# Patient Record
Sex: Female | Born: 1990 | Race: White | Hispanic: No | Smoking: Light tobacco smoker
Health system: Southern US, Community
[De-identification: ages and names within clinical notes are randomized; demographics above are authoritative.]

## PROBLEM LIST (undated history)

## (undated) DIAGNOSIS — Z789 Other specified health status: Secondary | ICD-10-CM

## (undated) DIAGNOSIS — E063 Autoimmune thyroiditis: Secondary | ICD-10-CM

## (undated) HISTORY — PX: OTHER SURGICAL HISTORY: SHX169

---

## 2016-07-16 ENCOUNTER — Encounter: Payer: Self-pay | Admitting: Obstetrics

## 2016-07-16 ENCOUNTER — Other Ambulatory Visit (HOSPITAL_COMMUNITY)
Admission: RE | Admit: 2016-07-16 | Discharge: 2016-07-16 | Disposition: A | Discharge status: Home / Self Care | Payer: Medicaid Other | Source: Ambulatory Visit | Attending: Obstetrics | Admitting: Obstetrics

## 2016-07-16 ENCOUNTER — Ambulatory Visit (INDEPENDENT_AMBULATORY_CARE_PROVIDER_SITE_OTHER): Payer: Medicaid Other | Admitting: Obstetrics

## 2016-07-16 VITALS — BP 123/77 | HR 93 | Temp 97.4°F | Ht 62.0 in | Wt 167.1 lb

## 2016-07-16 DIAGNOSIS — Z3490 Encounter for supervision of normal pregnancy, unspecified, unspecified trimester: Secondary | ICD-10-CM | POA: Insufficient documentation

## 2016-07-16 DIAGNOSIS — Z01411 Encounter for gynecological examination (general) (routine) with abnormal findings: Secondary | ICD-10-CM | POA: Insufficient documentation

## 2016-07-16 DIAGNOSIS — Z3400 Encounter for supervision of normal first pregnancy, unspecified trimester: Secondary | ICD-10-CM

## 2016-07-16 DIAGNOSIS — Z3402 Encounter for supervision of normal first pregnancy, second trimester: Secondary | ICD-10-CM | POA: Diagnosis not present

## 2016-07-16 NOTE — Progress Notes (Signed)
Subjective:    Joan Mccarty is being seen today for her first obstetrical visit.  This is not a planned pregnancy. She is at 4266w0d gestation. Her obstetrical history is significant for smoker. Relationship with FOB: significant other, not living together. Patient does intend to breast feed. Pregnancy history fully reviewed.  The information documented in the HPI was reviewed and verified.  Menstrual History: OB History    Gravida Para Term Preterm AB Living   1             SAB TAB Ectopic Multiple Live Births                  No LMP recorded (lmp unknown). Patient is pregnant.    History reviewed. No pertinent past medical history.  Past Surgical History:  Procedure Laterality Date  . wisdom toothh Bilateral      (Not in a hospital admission) Not on File  Social History  Substance Use Topics  . Smoking status: Current Some Day Smoker  . Smokeless tobacco: Never Used  . Alcohol use No    Family History  Problem Relation Age of Onset  . Adopted: Yes     Review of Systems Constitutional: negative for weight loss Gastrointestinal: negative for vomiting Genitourinary:negative for genital lesions and vaginal discharge and dysuria Musculoskeletal:negative for back pain Behavioral/Psych: negative for abusive relationship, depression, illegal drug usage and tobacco use    Objective:    BP 123/77   Pulse 93   Temp 97.4 F (36.3 C)   Ht 5\' 2"  (1.575 m)   Wt 167 lb 1.6 oz (75.8 kg)   LMP  (LMP Unknown)   BMI 30.56 kg/m  General Appearance:    Alert, cooperative, no distress, appears stated age  Head:    Normocephalic, without obvious abnormality, atraumatic  Eyes:    PERRL, conjunctiva/corneas clear, EOM's intact, fundi    benign, both eyes  Ears:    Normal TM's and external ear canals, both ears  Nose:   Nares normal, septum midline, mucosa normal, no drainage    or sinus tenderness  Throat:   Lips, mucosa, and tongue normal; teeth and gums normal  Neck:   Supple,  symmetrical, trachea midline, no adenopathy;    thyroid:  no enlargement/tenderness/nodules; no carotid   bruit or JVD  Back:     Symmetric, no curvature, ROM normal, no CVA tenderness  Lungs:     Clear to auscultation bilaterally, respirations unlabored  Chest Wall:    No tenderness or deformity   Heart:    Regular rate and rhythm, S1 and S2 normal, no murmur, rub   or gallop  Breast Exam:    No tenderness, masses, or nipple abnormality  Abdomen:     Soft, non-tender, bowel sounds active all four quadrants,    no masses, no organomegaly  Genitalia:    Normal female without lesion, discharge or tenderness  Extremities:   Extremities normal, atraumatic, no cyanosis or edema  Pulses:   2+ and symmetric all extremities  Skin:   Skin color, texture, turgor normal, no rashes or lesions  Lymph nodes:   Cervical, supraclavicular, and axillary nodes normal  Neurologic:   CNII-XII intact, normal strength, sensation and reflexes    throughout      Lab Review Urine pregnancy test Labs reviewed yes Radiologic studies reviewed yes Assessment:    Pregnancy at [redacted]w[redacted]d weeks    Plan:      Prenatal vitamins.  Counseling provided regarding continued use  of seat belts, cessation of alcohol consumption, smoking or use of illicit drugs; infection precautions i.e., influenza/TDAP immunizations, toxoplasmosis,CMV, parvovirus, listeria and varicella; workplace safety, exercise during pregnancy; routine dental care, safe medications, sexual activity, hot tubs, saunas, pools, travel, caffeine use, fish and methlymercury, potential toxins, hair treatments, varicose veins Weight gain recommendations per IOM guidelines reviewed: underweight/BMI< 18.5--> gain 28 - 40 lbs; normal weight/BMI 18.5 - 24.9--> gain 25 - 35 lbs; overweight/BMI 25 - 29.9--> gain 15 - 25 lbs; obese/BMI >30->gain  11 - 20 lbs Problem list reviewed and updated. FIRST/CF mutation testing/NIPT/QUAD SCREEN/fragile X/Ashkenazi Jewish population  testing/Spinal muscular atrophy discussed: requested. Role of ultrasound in pregnancy discussed; fetal survey: requested. Amniocentesis discussed: not indicated. VBAC calculator score: VBAC consent form provided No orders of the defined types were placed in this encounter.  No orders of the defined types were placed in this encounter.   Follow up in 4 weeks. 50% of 20 min visit spent on counseling and coordination of care. Patient ID: Joan Mccarty, female   DOB: 1991-05-11, 25 y.o.   MRN: [id number]

## 2016-07-16 NOTE — Addendum Note (Signed)
Addended by: Lillie ColumbiaJAMES, Rhina Kramme C on: 07/16/2016 09:37 AM   Modules accepted: Orders

## 2016-07-18 LAB — CYTOLOGY - PAP: DIAGNOSIS: NEGATIVE

## 2016-07-19 ENCOUNTER — Other Ambulatory Visit: Payer: Self-pay | Admitting: Obstetrics

## 2016-07-19 DIAGNOSIS — N760 Acute vaginitis: Principal | ICD-10-CM

## 2016-07-19 DIAGNOSIS — B9689 Other specified bacterial agents as the cause of diseases classified elsewhere: Secondary | ICD-10-CM

## 2016-07-19 LAB — NUSWAB VG+, CANDIDA 6SP
Atopobium vaginae: HIGH Score — AB
BVAB 2: HIGH {score} — AB
CANDIDA ALBICANS, NAA: NEGATIVE
CANDIDA PARAPSILOSIS, NAA: NEGATIVE
CANDIDA TROPICALIS, NAA: NEGATIVE
Candida glabrata, NAA: NEGATIVE
Candida krusei, NAA: NEGATIVE
Candida lusitaniae, NAA: NEGATIVE
Chlamydia trachomatis, NAA: NEGATIVE
MEGASPHAERA 1: HIGH {score} — AB
Neisseria gonorrhoeae, NAA: NEGATIVE
TRICH VAG BY NAA: NEGATIVE

## 2016-07-19 MED ORDER — METRONIDAZOLE 500 MG PO TABS: 500.0000 mg | ORAL_TABLET | Freq: Two times a day (BID) | ORAL | 2 refills | Status: DC

## 2016-07-22 NOTE — L&D Delivery Note (Signed)
Delivery Note  The patient progressed slowly through second stage labor. She intermittently patient rested. She had difficulty maintaining ineffective pushing  She was allowed to rest again while another cesarean section was being performed by another physician. An approximately 7:15 PM she had been counseled over the possibility that vacuum assistance could be offered. Her risks benefits and alternatives including cesarean section were discussed with the patient. The patient agreed to proceeding with vacuum-assisted attempted delivery. There was removed from the bladder. The bladder was empty. Was applied and patient allowed to push. This required approximately 5 contractions. There were 2 pop offs. This occurred on the first 2 contractions and steady progress is made. Once the baby delivered on the fifth contraction, there was a significant shoulder dystocia. The infant's right shoulder was impacted anteriorly and difficult access. [place], suprapubic pressure and wood screw counterclockwise rotation of the body were all required in combination to achieve delivery of the infant. Was able to rotate the fetal body completely in the vagina and delivered the posterior shoulder first once did rotated to an anterior position. The car was called and they were in her presence within a minute after the baby was born. The baby responded well to tactile stimulation and had good use of all extremities and responded to ventilation blow by oxygen and required no further support. Baby was taken care of by the mother. A mediolateral right-sided episiotomy had been performed to allow access to the posterior shoulder and this was repaired after delivery of the placenta which was intact Tomasa BlaseSchultz presentation three-vessel cord confirmed. EBL was 4 50 cc a mediolateral episiotomy did not involve the anal sphincter and patient had good repair under local anesthesia combined with the epidural and was able to show interest  in the baby and normal bonding efforts. Sponge and needle counts were correct At 7:26 AM a viable and healthy female was delivered via Vaginal, Vacuum (Extractor) (Presentation: ;  ).  APGAR: 8, 9; weight 8 lb 14.5 oz (4040 g).   Placenta status: , .  Cord:  with the following complications: .  Cord pH: The infant's chart would contain this information. Blood gas was collected  Anesthesia:   Episiotomy: Left Mediolateral Lacerations:   Suture Repair: 2.0 vicryl Est. Blood Loss (mL): 450  Mom to postpartum.  Baby to Couplet care / Skin to Skin.  Erez Mccallum V 02/11/2017, 3:27 PM

## 2016-08-13 ENCOUNTER — Ambulatory Visit (INDEPENDENT_AMBULATORY_CARE_PROVIDER_SITE_OTHER): Payer: Medicaid Other | Admitting: Obstetrics and Gynecology

## 2016-08-13 ENCOUNTER — Encounter: Payer: Self-pay | Admitting: Obstetrics and Gynecology

## 2016-08-13 DIAGNOSIS — F1911 Other psychoactive substance abuse, in remission: Secondary | ICD-10-CM | POA: Insufficient documentation

## 2016-08-13 DIAGNOSIS — Z3490 Encounter for supervision of normal pregnancy, unspecified, unspecified trimester: Secondary | ICD-10-CM

## 2016-08-13 DIAGNOSIS — Z3492 Encounter for supervision of normal pregnancy, unspecified, second trimester: Secondary | ICD-10-CM

## 2016-08-13 DIAGNOSIS — Z87898 Personal history of other specified conditions: Secondary | ICD-10-CM

## 2016-08-13 NOTE — Progress Notes (Signed)
Subjective:  Joan Mccarty is a 26 y.o. G2P1001 at 587w0d being seen today for ongoing prenatal care.  She is currently monitored for the following issues for this high-risk pregnancy and has Supervision of normal pregnancy, antepartum and History of drug abuse on her problem list.  Patient reports no complaints.  Contractions: Not present. Vag. Bleeding: None.  Movement: Present. Denies leaking of fluid.   The following portions of the patient's history were reviewed and updated as appropriate: allergies, current medications, past family history, past medical history, past social history, past surgical history and problem list. Problem list updated.  Objective:   Vitals:   08/13/16 0852  BP: 113/74  Pulse: 98  Weight: 170 lb (77.1 kg)    Fetal Status: Fetal Heart Rate (bpm): 148   Movement: Present     General:  Alert, oriented and cooperative. Patient is in no acute distress.  Skin: Skin is warm and dry. No rash noted.   Cardiovascular: Normal heart rate noted  Respiratory: Normal respiratory effort, no problems with respiration noted  Abdomen: Soft, gravid, appropriate for gestational age. Pain/Pressure: Absent     Pelvic:  Cervical exam deferred        Extremities: Normal range of motion.  Edema: None  Mental Status: Normal mood and affect. Normal behavior. Normal judgment and thought content.   Urinalysis:      Assessment and Plan:  Pregnancy: G2P1001 at 597w0d  1. Encounter for supervision of normal pregnancy, antepartum, unspecified gravidity Complete Prenatal labs today - AFP, Quad Screen - Obstetric Panel, Including HIV - Culture, OB Urine - Varicella zoster antibody, IgG - US MFM OB COMP + 14 WK; Future - ToxASSURE Select 13 (MW), Urine  2. History of drug abuse In remission x 5 yrs now [facility]  Preterm labor symptoms and general obstetric precautions including but not limited to vaginal bleeding, contractions, leaking of fluid and  fetal movement were reviewed in detail with the patient. Please refer to After Visit Summary for other counseling recommendations.  Return in about 4 weeks (around 09/10/2016) for OB visit.   Hermina StaggersMichael L Ervin, MD

## 2016-08-13 NOTE — Patient Instructions (Signed)
Second Trimester of Pregnancy The second trimester is from week 13 through week 28 (months 4 through 6). The second trimester is often a time when you feel your best. Your body has also adjusted to being pregnant, and you begin to feel better physically. Usually, morning sickness has lessened or quit completely, you may have more energy, and you may have an increase in appetite. The second trimester is also a time when the fetus is growing rapidly. At the end of the sixth month, the fetus is about 9 inches long and weighs about 1 pounds. You will likely begin to feel the baby move (quickening) between 18 and 20 weeks of the pregnancy. Body changes during your second trimester Your body continues to go through many changes during your second trimester. The changes vary from woman to woman.  Your weight will continue to increase. You will notice your lower abdomen bulging out.  You may begin to get stretch marks on your hips, abdomen, and breasts.  You may develop headaches that can be relieved by medicines. The medicines should be approved by your health care provider.  You may urinate more often because the fetus is pressing on your bladder.  You may develop or continue to have heartburn as a result of your pregnancy.  You may develop constipation because certain hormones are causing the muscles that push waste through your intestines to slow down.  You may develop hemorrhoids or swollen, bulging veins (varicose veins).  You may have back pain. This is caused by:  Weight gain.  Pregnancy hormones that are relaxing the joints in your pelvis.  A shift in weight and the muscles that support your balance.  Your breasts will continue to grow and they will continue to become tender.  Your gums may bleed and may be sensitive to brushing and flossing.  Dark spots or blotches (chloasma, mask of pregnancy) may develop on your face. This will likely fade after the baby is born.  A dark line  from your belly button to the pubic area (linea nigra) may appear. This will likely fade after the baby is born.  You may have changes in your hair. These can include thickening of your hair, rapid growth, and changes in texture. Some women also have hair loss during or after pregnancy, or hair that feels dry or thin. Your hair will most likely return to normal after your baby is born. What to expect at prenatal visits During a routine prenatal visit:  You will be weighed to make sure you and the fetus are growing normally.  Your blood pressure will be taken.  Your abdomen will be measured to track your baby's growth.  The fetal heartbeat will be listened to.  Any test results from the previous visit will be discussed. Your health care provider may ask you:  How you are feeling.  If you are feeling the baby move.  If you have had any abnormal symptoms, such as leaking fluid, bleeding, severe headaches, or abdominal cramping.  If you are using any tobacco products, including cigarettes, chewing tobacco, and electronic cigarettes.  If you have any questions. Other tests that may be performed during your second trimester include:  Blood tests that check for:  Low iron levels (anemia).  Gestational diabetes (between 24 and 28 weeks).  Rh antibodies. This is to check for a protein on red blood cells (Rh factor).  Urine tests to check for infections, diabetes, or protein in the urine.  An ultrasound to   confirm the proper growth and development of the baby.  An amniocentesis to check for possible genetic problems.  Fetal screens for spina bifida and Down syndrome.  HIV (human immunodeficiency virus) testing. Routine prenatal testing includes screening for HIV, unless you choose not to have this test. Follow these instructions at home: Eating and drinking  Continue to eat regular, healthy meals.  Avoid raw meat, uncooked cheese, cat litter boxes, and soil used by cats. These  carry germs that can cause birth defects in the baby.  Take your prenatal vitamins.  Take 1500-2000 mg of calcium daily starting at the 20th week of pregnancy until you deliver your baby.  If you develop constipation:  Take over-the-counter or prescription medicines.  Drink enough fluid to keep your urine clear or pale yellow.  Eat foods that are high in fiber, such as fresh fruits and vegetables, whole grains, and beans.  Limit foods that are high in fat and processed sugars, such as fried and sweet foods. Activity  Exercise only as directed by your health care provider. Experiencing uterine cramps is a good sign to stop exercising.  Avoid heavy lifting, wear low heel shoes, and practice good posture.  Wear your seat belt at all times when driving.  Rest with your legs elevated if you have leg cramps or low back pain.  Wear a good support bra for breast tenderness.  Do not use hot tubs, steam rooms, or saunas. Lifestyle  Avoid all smoking, herbs, alcohol, and unprescribed drugs. These chemicals affect the formation and growth of the baby.  Do not use any products that contain nicotine or tobacco, such as cigarettes and e-cigarettes. If you need help quitting, ask your health care provider.  A sexual relationship may be continued unless your health care provider directs you otherwise. General instructions  Follow your health care provider's instructions regarding medicine use. There are medicines that are either safe or unsafe to take during pregnancy.  Take warm sitz baths to soothe any pain or discomfort caused by hemorrhoids. Use hemorrhoid cream if your health care provider approves.  If you develop varicose veins, wear support hose. Elevate your feet for 15 minutes, 3-4 times a day. Limit salt in your diet.  Visit your dentist if you have not gone yet during your pregnancy. Use a soft toothbrush to brush your teeth and be gentle when you floss.  Keep all follow-up  prenatal visits as told by your health care provider. This is important. Contact a health care provider if:  You have dizziness.  You have mild pelvic cramps, pelvic pressure, or nagging pain in the abdominal area.  You have persistent nausea, vomiting, or diarrhea.  You have a bad smelling vaginal discharge.  You have pain with urination. Get help right away if:  You have a fever.  You are leaking fluid from your vagina.  You have spotting or bleeding from your vagina.  You have severe abdominal cramping or pain.  You have rapid weight gain or weight loss.  You have shortness of breath with chest pain.  You notice sudden or extreme swelling of your face, hands, ankles, feet, or legs.  You have not felt your baby move in over an hour.  You have severe headaches that do not go away with medicine.  You have vision changes. Summary  The second trimester is from week 13 through week 28 (months 4 through 6). It is also a time when the fetus is growing rapidly.  Your body goes   through many changes during pregnancy. The changes vary from woman to woman.  Avoid all smoking, herbs, alcohol, and unprescribed drugs. These chemicals affect the formation and growth your baby.  Do not use any tobacco products, such as cigarettes, chewing tobacco, and e-cigarettes. If you need help quitting, ask your health care provider.  Contact your health care provider if you have any questions. Keep all prenatal visits as told by your health care provider. This is important. This information is not intended to replace advice given to you by your health care provider. Make sure you discuss any questions you have with your health care provider. Document Released: 07/02/2001 Document Revised: 12/14/2015 Document Reviewed: 09/08/2012 Elsevier Interactive Patient Education  2017 [employer]vier Inc.  

## 2016-08-15 LAB — URINE CULTURE, OB REFLEX

## 2016-08-15 LAB — CULTURE, OB URINE

## 2016-08-18 LAB — TOXASSURE SELECT 13 (MW), URINE

## 2016-08-20 LAB — OBSTETRIC PANEL, INCLUDING HIV
Antibody Screen: NEGATIVE
BASOS ABS: 0 10*3/uL (ref 0.0–0.2)
Basos: 0 %
EOS (ABSOLUTE): 0.1 10*3/uL (ref 0.0–0.4)
Eos: 1 %
HIV Screen 4th Generation wRfx: NONREACTIVE
Hematocrit: 40.2 % (ref 34.0–46.6)
Hemoglobin: 13.4 g/dL (ref 11.1–15.9)
Hepatitis B Surface Ag: NEGATIVE
IMMATURE GRANS (ABS): 0 10*3/uL (ref 0.0–0.1)
Immature Granulocytes: 0 %
LYMPHS: 28 %
Lymphocytes Absolute: 2.2 10*3/uL (ref 0.7–3.1)
MCH: 28.9 pg (ref 26.6–33.0)
MCHC: 33.3 g/dL (ref 31.5–35.7)
MCV: 87 fL (ref 79–97)
MONOCYTES: 4 %
MONOS ABS: 0.3 10*3/uL (ref 0.1–0.9)
Neutrophils Absolute: 5.1 10*3/uL (ref 1.4–7.0)
Neutrophils: 67 %
PLATELETS: 168 10*3/uL (ref 150–379)
RBC: 4.64 x10E6/uL (ref 3.77–5.28)
RDW: 13.3 % (ref 12.3–15.4)
RPR Ser Ql: NONREACTIVE
RUBELLA: 1.38 {index} (ref >0.99)
Rh Factor: POSITIVE
WBC: 7.7 10*3/uL (ref 3.4–10.8)

## 2016-08-20 LAB — AFP, QUAD SCREEN
DIA MOM VALUE: 1.44
DIA Value (EIA): 243 pg/mL
DSR (BY AGE) 1 IN: 835
DSR (SECOND TRIMESTER) 1 IN: 8527
Gestational Age: 16 WEEKS
MATERNAL AGE AT EDD: 28.3 a
MSAFP Mom: 0.79
MSAFP: 23.7 ng/mL
MSHCG MOM: 0.49
MSHCG: 18112 m[IU]/mL
OSB RISK: 10000
T18 (By Age): 1:3252 {titer}
TEST RESULTS AFP: NEGATIVE
WEIGHT: 170 [lb_av]
uE3 Mom: 1.1
uE3 Value: 0.87 ng/mL

## 2016-08-20 LAB — VARICELLA ZOSTER ANTIBODY, IGG: VARICELLA: 521 {index} (ref >165)

## 2016-08-22 ENCOUNTER — Encounter (HOSPITAL_COMMUNITY): Payer: Self-pay | Admitting: Obstetrics and Gynecology

## 2016-08-28 ENCOUNTER — Ambulatory Visit (HOSPITAL_COMMUNITY)
Admission: RE | Admit: 2016-08-28 | Discharge: 2016-08-28 | Disposition: A | Discharge status: Home / Self Care | Payer: Medicaid Other | Source: Ambulatory Visit | Attending: Obstetrics and Gynecology | Admitting: Obstetrics and Gynecology

## 2016-08-28 ENCOUNTER — Other Ambulatory Visit: Payer: Self-pay | Admitting: Obstetrics and Gynecology

## 2016-08-28 DIAGNOSIS — Z3A18 18 weeks gestation of pregnancy: Secondary | ICD-10-CM | POA: Diagnosis not present

## 2016-08-28 DIAGNOSIS — O99322 Drug use complicating pregnancy, second trimester: Secondary | ICD-10-CM | POA: Insufficient documentation

## 2016-08-28 DIAGNOSIS — O99332 Smoking (tobacco) complicating pregnancy, second trimester: Secondary | ICD-10-CM | POA: Diagnosis not present

## 2016-08-28 DIAGNOSIS — F1920 Other psychoactive substance dependence, uncomplicated: Secondary | ICD-10-CM | POA: Diagnosis present

## 2016-08-28 DIAGNOSIS — Z3490 Encounter for supervision of normal pregnancy, unspecified, unspecified trimester: Secondary | ICD-10-CM

## 2016-08-28 DIAGNOSIS — F1120 Opioid dependence, uncomplicated: Secondary | ICD-10-CM

## 2016-09-10 ENCOUNTER — Encounter: Payer: Medicaid Other | Admitting: Obstetrics and Gynecology

## 2016-10-09 ENCOUNTER — Ambulatory Visit (HOSPITAL_COMMUNITY)
Admission: RE | Admit: 2016-10-09 | Discharge: 2016-10-09 | Disposition: A | Discharge status: Home / Self Care | Payer: Medicaid Other | Source: Ambulatory Visit | Attending: Obstetrics and Gynecology | Admitting: Obstetrics and Gynecology

## 2016-10-09 ENCOUNTER — Encounter (HOSPITAL_COMMUNITY): Payer: Self-pay

## 2016-10-09 DIAGNOSIS — F1910 Other psychoactive substance abuse, uncomplicated: Secondary | ICD-10-CM | POA: Diagnosis not present

## 2016-10-09 DIAGNOSIS — Z362 Encounter for other antenatal screening follow-up: Secondary | ICD-10-CM | POA: Insufficient documentation

## 2016-10-09 DIAGNOSIS — O99332 Smoking (tobacco) complicating pregnancy, second trimester: Secondary | ICD-10-CM | POA: Diagnosis not present

## 2016-10-09 DIAGNOSIS — Z3A24 24 weeks gestation of pregnancy: Secondary | ICD-10-CM | POA: Insufficient documentation

## 2016-10-09 DIAGNOSIS — F1120 Opioid dependence, uncomplicated: Secondary | ICD-10-CM

## 2016-10-09 DIAGNOSIS — O99322 Drug use complicating pregnancy, second trimester: Secondary | ICD-10-CM | POA: Insufficient documentation

## 2016-10-09 HISTORY — DX: Other specified health status: Z78.9

## 2016-10-10 ENCOUNTER — Other Ambulatory Visit (HOSPITAL_COMMUNITY): Payer: Self-pay | Admitting: *Deleted

## 2016-10-10 DIAGNOSIS — F1120 Opioid dependence, uncomplicated: Secondary | ICD-10-CM

## 2016-10-10 DIAGNOSIS — O99320 Drug use complicating pregnancy, unspecified trimester: Principal | ICD-10-CM

## 2016-11-20 ENCOUNTER — Encounter (HOSPITAL_COMMUNITY): Payer: Self-pay

## 2016-11-20 ENCOUNTER — Ambulatory Visit (HOSPITAL_COMMUNITY)
Admission: RE | Admit: 2016-11-20 | Discharge: 2016-11-20 | Disposition: A | Discharge status: Home / Self Care | Payer: Medicaid Other | Source: Ambulatory Visit | Attending: Obstetrics and Gynecology | Admitting: Obstetrics and Gynecology

## 2016-11-20 ENCOUNTER — Other Ambulatory Visit (HOSPITAL_COMMUNITY): Payer: Self-pay | Admitting: Maternal and Fetal Medicine

## 2016-11-20 DIAGNOSIS — O09893 Supervision of other high risk pregnancies, third trimester: Secondary | ICD-10-CM | POA: Diagnosis not present

## 2016-11-20 DIAGNOSIS — Z3A30 30 weeks gestation of pregnancy: Secondary | ICD-10-CM | POA: Diagnosis not present

## 2016-11-20 DIAGNOSIS — O99320 Drug use complicating pregnancy, unspecified trimester: Principal | ICD-10-CM

## 2016-11-20 DIAGNOSIS — O99333 Smoking (tobacco) complicating pregnancy, third trimester: Secondary | ICD-10-CM

## 2016-11-20 DIAGNOSIS — O99323 Drug use complicating pregnancy, third trimester: Secondary | ICD-10-CM | POA: Diagnosis present

## 2016-11-20 DIAGNOSIS — F1120 Opioid dependence, uncomplicated: Secondary | ICD-10-CM

## 2016-11-20 DIAGNOSIS — F17200 Nicotine dependence, unspecified, uncomplicated: Secondary | ICD-10-CM | POA: Insufficient documentation

## 2016-11-21 ENCOUNTER — Other Ambulatory Visit (HOSPITAL_COMMUNITY): Payer: Self-pay | Admitting: *Deleted

## 2016-11-21 DIAGNOSIS — O99323 Drug use complicating pregnancy, third trimester: Principal | ICD-10-CM

## 2016-11-21 DIAGNOSIS — F1120 Opioid dependence, uncomplicated: Secondary | ICD-10-CM

## 2016-12-02 ENCOUNTER — Encounter: Payer: Self-pay | Admitting: Obstetrics

## 2016-12-02 ENCOUNTER — Other Ambulatory Visit: Payer: Medicaid Other

## 2016-12-02 ENCOUNTER — Ambulatory Visit (INDEPENDENT_AMBULATORY_CARE_PROVIDER_SITE_OTHER): Payer: Medicaid Other | Admitting: Obstetrics

## 2016-12-02 VITALS — BP 111/75 | HR 93 | Wt 186.4 lb

## 2016-12-02 DIAGNOSIS — Z23 Encounter for immunization: Secondary | ICD-10-CM

## 2016-12-02 DIAGNOSIS — Z3483 Encounter for supervision of other normal pregnancy, third trimester: Secondary | ICD-10-CM

## 2016-12-02 DIAGNOSIS — Z3480 Encounter for supervision of other normal pregnancy, unspecified trimester: Secondary | ICD-10-CM

## 2016-12-02 DIAGNOSIS — J301 Allergic rhinitis due to pollen: Secondary | ICD-10-CM

## 2016-12-02 DIAGNOSIS — Z3490 Encounter for supervision of normal pregnancy, unspecified, unspecified trimester: Secondary | ICD-10-CM

## 2016-12-02 MED ORDER — LORATADINE 10 MG PO TABS: 10.0000 mg | ORAL_TABLET | Freq: Every day | ORAL | 11 refills | Status: DC

## 2016-12-02 NOTE — Addendum Note (Signed)
Addended by: Hamilton CapriBURCH, ARIEL J on: 12/02/2016 11:39 AM   Modules accepted: Orders

## 2016-12-02 NOTE — Addendum Note (Signed)
Addended by: Dalphine HandingGARDNER, Laquanda Bick L on: 12/02/2016 11:54 AM   Modules accepted: Orders

## 2016-12-02 NOTE — Addendum Note (Signed)
Addended by: Dalphine HandingGARDNER, Fares Ramthun L on: 12/02/2016 12:02 PM   Modules accepted: Orders

## 2016-12-02 NOTE — Progress Notes (Signed)
Subjective:  Joan Mccarty is a 26 y.o. G2P1001 at 3267w6d being seen today for ongoing prenatal care.  She is currently monitored for the following issues for this high-risk pregnancy and has Supervision of normal pregnancy, antepartum and History of drug abuse on her problem list.  Patient reports allergy to pollen worse.  Contractions: Not present. Vag. Bleeding: None.  Movement: Present. Denies leaking of fluid.   The following portions of the patient's history were reviewed and updated as appropriate: allergies, current medications, past family history, past medical history, past social history, past surgical history and problem list. Problem list updated.  Objective:   Vitals:   12/02/16 0910  BP: 111/75  Pulse: 93  Weight: 186 lb 6.4 oz (84.6 kg)    Fetal Status: Fetal Heart Rate (bpm): 126   Movement: Present     General:  Alert, oriented and cooperative. Patient is in no acute distress.  Skin: Skin is warm and dry. No rash noted.   Cardiovascular: Normal heart rate noted  Respiratory: Normal respiratory effort, no problems with respiration noted  Abdomen: Soft, gravid, appropriate for gestational age. Pain/Pressure: Absent     Pelvic:  Cervical exam deferred        Extremities: Normal range of motion.  Edema: None  Mental Status: Normal mood and affect. Normal behavior. Normal judgment and thought content.   Urinalysis:      Assessment and Plan:  Pregnancy: G2P1001 at 267w6d  1. Encounter for supervision of normal pregnancy, antepartum, unspecified gravidity Rx: - Tdap vaccine greater than or equal to 7yo IM  2. Seasonal allergic rhinitis due to pollen Rx: - loratadine (CLARITIN) 10 MG tablet; Take 1 tablet (10 mg total) by mouth daily.  Dispense: 30 tablet; Refill: 11  Preterm labor symptoms and general obstetric precautions including but not limited to vaginal bleeding, contractions, leaking of fluid and fetal movement were reviewed in detail with the patient. Please  refer to After Visit Summary for other counseling recommendations.  No Follow-up on file.   Brock BadHarper, Dyneshia Baccam A, MDPatient ID: Joan Mccarty, female   DOB: 1991/06/04, 26 y.o.   MRN: [id number]

## 2016-12-03 LAB — RPR: RPR: NONREACTIVE

## 2016-12-03 LAB — GLUCOSE TOLERANCE, 2 HOURS W/ 1HR
GLUCOSE, 1 HOUR: 91 mg/dL (ref 65–179)
GLUCOSE, FASTING: 68 mg/dL (ref 65–91)
Glucose, 2 hour: 80 mg/dL (ref 65–152)

## 2016-12-03 LAB — CBC
Hematocrit: 36.9 % (ref 34.0–46.6)
Hemoglobin: 12.2 g/dL (ref 11.1–15.9)
MCH: 29.2 pg (ref 26.6–33.0)
MCHC: 33.1 g/dL (ref 31.5–35.7)
MCV: 88 fL (ref 79–97)
PLATELETS: 187 10*3/uL (ref 150–379)
RBC: 4.18 x10E6/uL (ref 3.77–5.28)
RDW: 13.3 % (ref 12.3–15.4)
WBC: 10.5 10*3/uL (ref 3.4–10.8)

## 2016-12-03 LAB — HIV ANTIBODY (ROUTINE TESTING W REFLEX): HIV SCREEN 4TH GENERATION: NONREACTIVE

## 2016-12-07 LAB — GLUCOSE TOLERANCE, 2 HOURS

## 2016-12-08 ENCOUNTER — Other Ambulatory Visit: Payer: Self-pay | Admitting: Obstetrics

## 2016-12-08 DIAGNOSIS — B3731 Acute candidiasis of vulva and vagina: Secondary | ICD-10-CM

## 2016-12-08 DIAGNOSIS — B373 Candidiasis of vulva and vagina: Secondary | ICD-10-CM

## 2016-12-08 MED ORDER — FLUCONAZOLE 150 MG PO TABS: 150.0000 mg | ORAL_TABLET | Freq: Once | ORAL | 0 refills | Status: DC

## 2016-12-17 ENCOUNTER — Encounter: Payer: Medicaid Other | Admitting: Obstetrics

## 2017-01-01 ENCOUNTER — Encounter: Payer: Medicaid Other | Admitting: Obstetrics

## 2017-01-02 ENCOUNTER — Encounter (HOSPITAL_COMMUNITY): Payer: Self-pay

## 2017-01-02 ENCOUNTER — Other Ambulatory Visit (HOSPITAL_COMMUNITY): Payer: Self-pay | Admitting: Maternal and Fetal Medicine

## 2017-01-02 ENCOUNTER — Ambulatory Visit (HOSPITAL_COMMUNITY)
Admission: RE | Admit: 2017-01-02 | Discharge: 2017-01-02 | Disposition: A | Discharge status: Home / Self Care | Payer: Medicaid Other | Source: Ambulatory Visit | Attending: Obstetrics and Gynecology | Admitting: Obstetrics and Gynecology

## 2017-01-02 DIAGNOSIS — O99333 Smoking (tobacco) complicating pregnancy, third trimester: Secondary | ICD-10-CM | POA: Insufficient documentation

## 2017-01-02 DIAGNOSIS — Z3A36 36 weeks gestation of pregnancy: Secondary | ICD-10-CM | POA: Diagnosis not present

## 2017-01-02 DIAGNOSIS — F1120 Opioid dependence, uncomplicated: Secondary | ICD-10-CM

## 2017-01-02 DIAGNOSIS — O99323 Drug use complicating pregnancy, third trimester: Secondary | ICD-10-CM | POA: Insufficient documentation

## 2017-01-02 NOTE — Addendum Note (Signed)
Encounter addended by: Levonne HubertStalter, Michall Noffke M on: 01/02/2017  4:15 PM<BR>    Actions taken: Imaging Exam ended

## 2017-01-06 ENCOUNTER — Ambulatory Visit (INDEPENDENT_AMBULATORY_CARE_PROVIDER_SITE_OTHER): Payer: Medicaid Other | Admitting: Obstetrics

## 2017-01-06 ENCOUNTER — Encounter: Payer: Self-pay | Admitting: Obstetrics

## 2017-01-06 ENCOUNTER — Encounter (HOSPITAL_COMMUNITY): Payer: Self-pay | Admitting: *Deleted

## 2017-01-06 ENCOUNTER — Other Ambulatory Visit (HOSPITAL_COMMUNITY)
Admission: RE | Admit: 2017-01-06 | Discharge: 2017-01-06 | Disposition: A | Discharge status: Home / Self Care | Payer: Medicaid Other | Source: Ambulatory Visit | Attending: Obstetrics | Admitting: Obstetrics

## 2017-01-06 ENCOUNTER — Telehealth (HOSPITAL_COMMUNITY): Payer: Self-pay | Admitting: *Deleted

## 2017-01-06 VITALS — BP 131/66 | HR 92 | Wt 194.6 lb

## 2017-01-06 DIAGNOSIS — Z3493 Encounter for supervision of normal pregnancy, unspecified, third trimester: Secondary | ICD-10-CM | POA: Diagnosis not present

## 2017-01-06 DIAGNOSIS — Z3A00 Weeks of gestation of pregnancy not specified: Secondary | ICD-10-CM | POA: Diagnosis not present

## 2017-01-06 DIAGNOSIS — F17210 Nicotine dependence, cigarettes, uncomplicated: Secondary | ICD-10-CM

## 2017-01-06 DIAGNOSIS — F1911 Other psychoactive substance abuse, in remission: Secondary | ICD-10-CM

## 2017-01-06 LAB — OB RESULTS CONSOLE GBS: GBS: NEGATIVE

## 2017-01-06 NOTE — Progress Notes (Signed)
GBS with reflex due. Pt wants cx checked today.

## 2017-01-06 NOTE — Telephone Encounter (Signed)
Preadmission screen  

## 2017-01-06 NOTE — Progress Notes (Signed)
Subjective:  Joan Mccarty is a 26 y.o. G2P1001 at 1378w6d being seen today for ongoing prenatal care.  She is currently monitored for the following issues for this high-risk pregnancy and has Supervision of normal pregnancy, antepartum; History of drug abuse; and Seasonal allergic rhinitis due to pollen on her problem list.  Patient reports no complaints.  Contractions: Not present. Vag. Bleeding: None.  Movement: Present. Denies leaking of fluid.   The following portions of the patient's history were reviewed and updated as appropriate: allergies, current medications, past family history, past medical history, past social history, past surgical history and problem list. Problem list updated.  Objective:   Vitals:   01/06/17 1120  BP: 131/66  Pulse: 92  Weight: 194 lb 9.6 oz (88.3 kg)    Fetal Status: Fetal Heart Rate (bpm): 130   Movement: Present     General:  Alert, oriented and cooperative. Patient is in no acute distress.  Skin: Skin is warm and dry. No rash noted.   Cardiovascular: Normal heart rate noted  Respiratory: Normal respiratory effort, no problems with respiration noted  Abdomen: Soft, gravid, appropriate for gestational age. Pain/Pressure: Present     Pelvic:  Cervical exam deferred        Extremities: Normal range of motion.  Edema: Trace  Mental Status: Normal mood and affect. Normal behavior. Normal judgment and thought content.   Urinalysis:      Assessment and Plan:  Pregnancy: G2P1001 at 5178w6d  1. Encounter for supervision of normal pregnancy in third trimester, unspecified gravidity Rx: - Strep Gp B Culture+Rflx - Cervicovaginal ancillary only  2. 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 Tobacco dependence due to cigarettes   Term labor symptoms and general obstetric precautions including but not limited to vaginal bleeding, contractions, leaking of fluid and fetal movement were reviewed in detail with the  patient. Please refer to After Visit Summary for other counseling recommendations.  Return in about 1 week (around 01/13/2017) for ROB.   Brock BadHarper, Charles A, MDPatient ID: Joan QuinceNicole Scallon, female   DOB: 1991-01-12, 26 y.o.   MRN: [id number]

## 2017-01-08 LAB — CERVICOVAGINAL ANCILLARY ONLY
Bacterial vaginitis: NEGATIVE
CANDIDA VAGINITIS: NEGATIVE
Chlamydia: NEGATIVE
Neisseria Gonorrhea: NEGATIVE
Trichomonas: NEGATIVE

## 2017-01-10 LAB — STREP GP B CULTURE+RFLX: STREP GP B CULTURE+RFLX: NEGATIVE

## 2017-01-15 ENCOUNTER — Ambulatory Visit (INDEPENDENT_AMBULATORY_CARE_PROVIDER_SITE_OTHER): Payer: Medicaid Other | Admitting: Certified Nurse Midwife

## 2017-01-15 VITALS — BP 120/74 | HR 87 | Wt 198.0 lb

## 2017-01-15 DIAGNOSIS — F1911 Other psychoactive substance abuse, in remission: Secondary | ICD-10-CM

## 2017-01-15 DIAGNOSIS — Z3493 Encounter for supervision of normal pregnancy, unspecified, third trimester: Secondary | ICD-10-CM

## 2017-01-15 DIAGNOSIS — Z3490 Encounter for supervision of normal pregnancy, unspecified, unspecified trimester: Secondary | ICD-10-CM

## 2017-01-15 NOTE — Progress Notes (Signed)
   PRENATAL VISIT NOTE  Subjective:  Joan Mccarty is a 26 y.o. G2P1001 at 281w1d being seen today for ongoing prenatal care.  She is currently monitored for the following issues for this high-risk pregnancy and has Supervision of normal pregnancy, antepartum; History of drug abuse; and Seasonal allergic rhinitis due to pollen on her problem list.  Patient reports no complaints.  Contractions: Not present. Vag. Bleeding: None.  Movement: Present. Denies leaking of fluid.   The following portions of the patient's history were reviewed and updated as appropriate: allergies, current medications, past family history, past medical history, past social history, past surgical history and problem list. Problem list updated.  Objective:   Vitals:   01/15/17 1313  BP: 120/74  Pulse: 87  Weight: 198 lb (89.8 kg)    Fetal Status: Fetal Heart Rate (bpm): 125 Fundal Height: 38 cm Movement: Present     General:  Alert, oriented and cooperative. Patient is in no acute distress.  Skin: Skin is warm and dry. No rash noted.   Cardiovascular: Normal heart rate noted  Respiratory: Normal respiratory effort, no problems with respiration noted  Abdomen: Soft, gravid, appropriate for gestational age. Pain/Pressure: Present     Pelvic:  Cervical exam deferred        Extremities: Normal range of motion.  Edema: Moderate pitting, indentation subsides rapidly  Mental Status: Normal mood and affect. Normal behavior. Normal judgment and thought content.   Assessment and Plan:  Pregnancy: G2P1001 at 285w1d  1. Encounter for supervision of normal pregnancy, antepartum, unspecified gravidity    Doing well.  IOL scheduled for 01/21/17.    2. History of drug abuse     On Methadone.    Term labor symptoms and general obstetric precautions including but not limited to vaginal bleeding, contractions, leaking of fluid and fetal movement were reviewed in detail with the patient. Please refer to After Visit Summary for  other counseling recommendations.  Return in about 6 weeks (around 02/26/2017) for Postpartum.   Roe Coombsachelle A Angelissa Supan, CNM

## 2017-01-15 NOTE — Patient Instructions (Addendum)
AREA PEDIATRIC/FAMILY PRACTICE PHYSICIANS  [facility] [address], [address] [place], [place]  [address] Phone - [phone]   Fax - [phone]  [facility] [address] [place], [place] [address] Phone - [phone]   Fax - [phone]  [facility] [address] [place], [place]  [address] Phone - [phone]   Fax - [phone]  [facility] [address] [place], [place]  [address] Phone - [phone]   Fax - [phone]  [facility] [address] [place], [place]  [address] Phone - [phone]   Fax - [phone]  [facility] [address], [address] [place], [place]  [address] Phone - [phone]   Fax - [phone]  [facility] [address], [address] [place], [place]  [address] Phone - [phone]   Fax - [phone]  [facility] [address], [address] [place], [place]  [address] Phone - [phone]   Fax - [phone]  [facility] [address] [place], [place]  [address] Phone - [phone]   Fax - [phone] [facility] [address] [place], [place]  [address] Phone - [phone]   Fax - [phone]  [facility] [address] [place], [place]  [address] Phone - [phone]   Fax - [phone]  [facility] [address] [place], [place]  [address] Phone - [phone]   Fax - [phone]  [facility] [address], [address] [place], [place]  [address] Phone - [phone]   Fax - [phone]  [facility]IL[facility] GOSR[facility] [address] [place], [place]  [address] Phone - [phone]  [facility] [address] [place], [place]  [address] Phone - [phone]   Fax - [phone]  [facility] [address] [place], [place]  [address] Phone - [phone]   Fax - [phone]  [facility] [address] [place], [place]  [address] Phone - [phone]   Fax - [phone]  [facility] [address] [place], [place]  [address] Phone - [phone]   Fax - [phone]   [facility] [address] [place], [place]  [address] Phone - [phone]   Fax - [phone]  [facility] [address] [place], [place]  [address] Phone - [phone]   Fax - [phone]  [facility] RUB[facility] [address] [place], [place]  [address] Phone - [phone]   Fax - [phone]  [facility] [address] [place], [place]  [address] Phone - [phone]   Fax - [phone]  [facility] [address] , [place]  [address] Phone - [phone]   Fax - [phone] [facility] [address] [place], [place]  [address] Phone - [phone]   Fax - [phone]  [facility] [address] [place], [place]  [address] Phone - [phone]   Fax - [phone]  [facility] [address] [place],   [address] Phone - [phone]   Fax - [phone]  [facility] Charlene Flemming MD [address] [place]  [address] Phone [phone]  Fax [phone]  Contraception Choices Contraception (birth control) is the use of any methods or devices to prevent   pregnancy. Below are some methods to help avoid pregnancy. Hormonal methods  Contraceptive implant. This is a thin, plastic tube containing progesterone hormone. It does not contain estrogen hormone. Your health care provider inserts the tube in the inner part of the upper arm. The tube can remain in place for up to 3 years. After 3 years, the implant must be removed. The implant prevents the  ovaries from releasing an egg (ovulation), thickens the cervical mucus to prevent sperm from entering the uterus, and thins the lining of the inside of the uterus.  Progesterone-only injections. These injections are given every 3 months by your health care provider to prevent pregnancy. This synthetic progesterone hormone stops the ovaries from releasing eggs. It also thickens cervical mucus and changes the uterine lining. This makes it harder for sperm to survive in the uterus.  Birth control pills. These pills contain estrogen and progesterone hormone. They work by preventing the ovaries from releasing eggs (ovulation). They also cause the cervical mucus to thicken, preventing the sperm from entering the uterus. Birth control pills are prescribed by a health care provider.Birth control pills can also be used to treat heavy periods.  Minipill. This type of birth control pill contains only the progesterone hormone. They are taken every day of each month and must be prescribed by your health care provider.  Birth control patch. The patch contains hormones similar to those in birth control pills. It must be changed once a week and is prescribed by a health care provider.  Vaginal ring. The ring contains hormones similar to those in birth control pills. It is left in the vagina for 3 weeks, removed for 1 week, and then a new one is put back in place. The patient must be comfortable inserting and removing the ring from the vagina.A health care provider's prescription is necessary.  Emergency contraception. Emergency contraceptives prevent pregnancy after unprotected sexual intercourse. This pill can be taken right after sex or up to 5 days after unprotected sex. It is most effective the sooner you take the pills after having sexual intercourse. Most emergency contraceptive pills are available without a prescription. Check with your pharmacist. Do not use emergency contraception as your only form of birth  control. Barrier methods  Female condom. This is a thin sheath (latex or rubber) that is worn over the penis during sexual intercourse. It can be used with spermicide to increase effectiveness.  Female condom. This is a soft, loose-fitting sheath that is put into the vagina before sexual intercourse.  Diaphragm. This is a soft, latex, dome-shaped barrier that must be fitted by a health care provider. It is inserted into the vagina, along with a spermicidal jelly. It is inserted before intercourse. The diaphragm should be left in the vagina for 6 to 8 hours after intercourse.  Cervical cap. This is a round, soft, latex or plastic cup that fits over the cervix and must be fitted by a health care provider. The cap can be left in place for up to 48 hours after intercourse.  Sponge. This is a soft, circular piece of polyurethane foam. The sponge has spermicide in it. It is inserted into the vagina after wetting it and before sexual intercourse.  Spermicides. These are chemicals that kill or block sperm from entering the cervix and uterus. They come in the form of creams, jellies, suppositories, foam, or tablets. They do not require a prescription. They are inserted into the vagina with an applicator before having sexual intercourse. The process   must be repeated every time you have sexual intercourse. Intrauterine contraception  Intrauterine device (IUD). This is a T-shaped device that is put in a woman's uterus during a menstrual period to prevent pregnancy. There are 2 types: ? Copper IUD. This type of IUD is wrapped in copper wire and is placed inside the uterus. Copper makes the uterus and fallopian tubes produce a fluid that kills sperm. It can stay in place for 10 years. ? Hormone IUD. This type of IUD contains the hormone progestin (synthetic progesterone). The hormone thickens the cervical mucus and prevents sperm from entering the uterus, and it also thins the uterine lining to prevent  implantation of a fertilized egg. The hormone can weaken or kill the sperm that get into the uterus. It can stay in place for 3-5 years, depending on which type of IUD is used. Permanent methods of contraception  Female tubal ligation. This is when the woman's fallopian tubes are surgically sealed, tied, or blocked to prevent the egg from traveling to the uterus.  Hysteroscopic sterilization. This involves placing a small coil or insert into each fallopian tube. Your doctor uses a technique called hysteroscopy to do the procedure. The device causes scar tissue to form. This results in permanent blockage of the fallopian tubes, so the sperm cannot fertilize the egg. It takes about 3 months after the procedure for the tubes to become blocked. You must use another form of birth control for these 3 months.  Female sterilization. This is when the female has the tubes that carry sperm tied off (vasectomy).This blocks sperm from entering the vagina during sexual intercourse. After the procedure, the man can still ejaculate fluid (semen). Natural planning methods  Natural family planning. This is not having sexual intercourse or using a barrier method (condom, diaphragm, cervical cap) on days the woman could become pregnant.  Calendar method. This is keeping track of the length of each menstrual cycle and identifying when you are fertile.  Ovulation method. This is avoiding sexual intercourse during ovulation.  Symptothermal method. This is avoiding sexual intercourse during ovulation, using a thermometer and ovulation symptoms.  Post-ovulation method. This is timing sexual intercourse after you have ovulated. Regardless of which type or method of contraception you choose, it is important that you use condoms to protect against the transmission of sexually transmitted infections (STIs). Talk with your health care provider about which form of contraception is most appropriate for you. This information is not  intended to replace advice given to you by your health care provider. Make sure you discuss any questions you have with your health care provider. Document Released: 07/08/2005 Document Revised: 12/14/2015 Document Reviewed: 12/31/2012 Elsevier Interactive Patient Education  2017 Elsevier Inc.  

## 2017-01-20 ENCOUNTER — Telehealth: Payer: Self-pay | Admitting: Advanced Practice Midwife

## 2017-01-20 ENCOUNTER — Other Ambulatory Visit: Payer: Self-pay | Admitting: Obstetrics & Gynecology

## 2017-01-20 ENCOUNTER — Other Ambulatory Visit: Payer: Self-pay | Admitting: Certified Nurse Midwife

## 2017-01-20 ENCOUNTER — Telehealth: Payer: Self-pay | Admitting: *Deleted

## 2017-01-20 NOTE — Telephone Encounter (Signed)
Spoke with patient to cancel her IOL scheduled for 01/21/17. Upon chart review, could find no indication. I contacted Rachelle who spoke with Dr Clearance CootsHarper, saying she can be moved off the schedule as there was not an indication. Pt upset. Apology offered. Pt hung up abruptly.  Cam HaiSHAW, KIMBERLY CNM 01/20/2017 12:39 PM

## 2017-01-20 NOTE — Telephone Encounter (Signed)
Pt called to office and is upset and would like to know why her induction was cancelled for 01/21/17. Pt states that she had this discussion with provider and was told she should be able to have induction as scheduled. Pt is upset that she has missed work and has plans made to be induced tomorrow and now that has changed. Pt was made aware that there is no medical reason for her to be induced and that it has been cancelled.  Pt upset that it was less than 24 hours that she was notified about cancellation.  Asked pt if she would like to make a ROB appt as she will need to be seen since IOL cancel. Pt states she sees no reason for appt, to much gas for her to drive here for 5 min to listen to baby heart rate and send on her way. Pt declines to schedule appt today.  Pt made aware she will need to be seen before her due date if no labor in order to have delivery plan.  I discussed with pt that message could be sent to provider and ask that provider call pt and discuss with her further. Pt would like phone call back from provider.      Please call pt to discuss plan.

## 2017-01-21 ENCOUNTER — Other Ambulatory Visit: Payer: Self-pay | Admitting: Obstetrics

## 2017-01-21 ENCOUNTER — Inpatient Hospital Stay (HOSPITAL_COMMUNITY): Admission: RE | Admit: 2017-01-21 | Payer: Medicaid Other | Source: Ambulatory Visit

## 2017-01-21 ENCOUNTER — Encounter (HOSPITAL_COMMUNITY): Payer: Self-pay | Admitting: *Deleted

## 2017-01-21 NOTE — Progress Notes (Signed)
Patient arrived in MAU for IOL. Discussed POC with Dr. Adrian BlackwaterStinson. RN advised that patient would need to make an appointment in office this week, that at this time there is no medical indication for IOL. Pt very confused and upset that she was instructed by Dr. Clearance CootsHarper to come in now being told to go home. Support and encouragement given to PT and SO. Pt given labor precautions. Pt states baby is moving less however moving. Encouraged pt to be seen in MAU, pt refused and left. Dr. Adrian BlackwaterStinson notified of pt departure from hospital.

## 2017-01-23 ENCOUNTER — Ambulatory Visit (INDEPENDENT_AMBULATORY_CARE_PROVIDER_SITE_OTHER): Payer: Medicaid Other | Admitting: Obstetrics

## 2017-01-23 ENCOUNTER — Encounter: Payer: Self-pay | Admitting: Obstetrics

## 2017-01-23 DIAGNOSIS — Z3490 Encounter for supervision of normal pregnancy, unspecified, unspecified trimester: Secondary | ICD-10-CM

## 2017-01-23 DIAGNOSIS — Z3483 Encounter for supervision of other normal pregnancy, third trimester: Secondary | ICD-10-CM

## 2017-01-23 NOTE — Progress Notes (Signed)
Patient reports good fetal movement and occasional pressure. 

## 2017-01-23 NOTE — Progress Notes (Signed)
Subjective:  Joan Mccarty is a 26 y.o. G2P1001 at 8173w2d being seen today for ongoing prenatal care.  She is currently monitored for the following issues for this low-risk pregnancy and has Supervision of normal pregnancy, antepartum; History of drug abuse; and Seasonal allergic rhinitis due to pollen on her problem list.  Patient reports backache and pressure.  Contractions: Irregular. Vag. Bleeding: None.  Movement: Present. Denies leaking of fluid.   The following portions of the patient's history were reviewed and updated as appropriate: allergies, current medications, past family history, past medical history, past social history, past surgical history and problem list. Problem list updated.  Objective:   Vitals:   01/23/17 1448  BP: 130/83  Pulse: 91  Weight: 202 lb (91.6 kg)    Fetal Status: Fetal Heart Rate (bpm): 130   Movement: Present     General:  Alert, oriented and cooperative. Patient is in no acute distress.  Skin: Skin is warm and dry. No rash noted.   Cardiovascular: Normal heart rate noted  Respiratory: Normal respiratory effort, no problems with respiration noted  Abdomen: Soft, gravid, appropriate for gestational age. Pain/Pressure: Present     Pelvic:  Cervical exam performed      Cvx:  Closed / 50% / -3 / Vtx  Extremities: Normal range of motion.  Edema: Mild pitting, slight indentation  Mental Status: Normal mood and affect. Normal behavior. Normal judgment and thought content.   Urinalysis:      Assessment and Plan:  Pregnancy: G2P1001 at 3973w2d  1. Encounter for supervision of normal pregnancy, antepartum, unspecified gravidity   Term labor symptoms and general obstetric precautions including but not limited to vaginal bleeding, contractions, leaking of fluid and fetal movement were reviewed in detail with the patient. Please refer to After Visit Summary for other counseling recommendations.  Return in about 1 week (around 01/30/2017) for  ROB.   Brock BadHarper, Charles A, MDPatient ID: Joan Mccarty, female   DOB: 08-Mar-1991, 26 y.o.   MRN: [id number]

## 2017-01-30 ENCOUNTER — Ambulatory Visit (INDEPENDENT_AMBULATORY_CARE_PROVIDER_SITE_OTHER): Payer: Medicaid Other | Admitting: Certified Nurse Midwife

## 2017-01-30 ENCOUNTER — Telehealth (HOSPITAL_COMMUNITY): Payer: Self-pay | Admitting: *Deleted

## 2017-01-30 VITALS — BP 136/81 | HR 92 | Wt 201.8 lb

## 2017-01-30 DIAGNOSIS — Z3483 Encounter for supervision of other normal pregnancy, third trimester: Secondary | ICD-10-CM

## 2017-01-30 DIAGNOSIS — Z87898 Personal history of other specified conditions: Secondary | ICD-10-CM

## 2017-01-30 DIAGNOSIS — Z3480 Encounter for supervision of other normal pregnancy, unspecified trimester: Secondary | ICD-10-CM

## 2017-01-30 DIAGNOSIS — F1911 Other psychoactive substance abuse, in remission: Secondary | ICD-10-CM

## 2017-01-30 NOTE — Telephone Encounter (Signed)
Preadmission screen  

## 2017-01-30 NOTE — Progress Notes (Signed)
Patient is concerned about delivery.

## 2017-01-30 NOTE — Progress Notes (Signed)
   PRENATAL VISIT NOTE  Subjective:  Quillian Quinceicole Kroeker is a 26 y.o. G2P1001 at 6826w2d being seen today for ongoing prenatal care.  She is currently monitored for the following issues for this low-risk pregnancy and has Supervision of normal pregnancy, antepartum; History of drug abuse; and Seasonal allergic rhinitis due to pollen on her problem list.  Patient reports no complaints.  Contractions: Irritability. Vag. Bleeding: None.  Movement: Present. Denies leaking of fluid.   The following portions of the patient's history were reviewed and updated as appropriate: allergies, current medications, past family history, past medical history, past social history, past surgical history and problem list. Problem list updated.  Objective:   Vitals:   01/30/17 0855  BP: 136/81  Pulse: 92  Weight: 201 lb 12.8 oz (91.5 kg)    Fetal Status: Fetal Heart Rate (bpm): NST: reactive Fundal Height: 40 cm Movement: Present  Presentation: Vertex  General:  Alert, oriented and cooperative. Patient is in no acute distress.  Skin: Skin is warm and dry. No rash noted.   Cardiovascular: Normal heart rate noted  Respiratory: Normal respiratory effort, no problems with respiration noted  Abdomen: Soft, gravid, appropriate for gestational age. Pain/Pressure: Present     Pelvic:  Cervical exam performed Dilation: Fingertip Effacement (%): 0 Station: Ballotable  Extremities: Normal range of motion.  Edema: Moderate pitting, indentation subsides rapidly  Mental Status: Normal mood and affect. Normal behavior. Normal judgment and thought content.  NST: + accels, no decels, moderate variability, Cat. 1 tracing. occasional contractions on toco.   Assessment and Plan:  Pregnancy: G2P1001 at 5426w2d  1. History of drug abuse     On Methadone  2. Supervision of other normal pregnancy, antepartum     Doing well. Post dates. Slightly elevated blood pressure, still normotensive.  Post dates.  NST reactive.   IOL scheduled  for 02/04/17.         Term labor symptoms and general obstetric precautions including but not limited to vaginal bleeding, contractions, leaking of fluid and fetal movement were reviewed in detail with the patient. Please refer to After Visit Summary for other counseling recommendations.  Return in about 4 weeks (around 02/27/2017) for Postpartum.   Roe Coombsachelle A Denney, CNM

## 2017-01-31 ENCOUNTER — Other Ambulatory Visit: Payer: Self-pay | Admitting: Advanced Practice Midwife

## 2017-02-05 ENCOUNTER — Inpatient Hospital Stay (HOSPITAL_COMMUNITY): Payer: Medicaid Other | Admitting: Anesthesiology

## 2017-02-05 ENCOUNTER — Encounter (HOSPITAL_COMMUNITY): Payer: Self-pay

## 2017-02-05 ENCOUNTER — Inpatient Hospital Stay (HOSPITAL_COMMUNITY)
Admission: RE | Admit: 2017-02-05 | Discharge: 2017-02-08 | DRG: 775 | Disposition: A | Discharge status: Home / Self Care | Payer: Medicaid Other | Source: Ambulatory Visit | Attending: Obstetrics and Gynecology | Admitting: Obstetrics and Gynecology

## 2017-02-05 DIAGNOSIS — Z6836 Body mass index (BMI) 36.0-36.9, adult: Secondary | ICD-10-CM | POA: Diagnosis not present

## 2017-02-05 DIAGNOSIS — Z880 Allergy status to penicillin: Secondary | ICD-10-CM

## 2017-02-05 DIAGNOSIS — O99324 Drug use complicating childbirth: Secondary | ICD-10-CM | POA: Diagnosis present

## 2017-02-05 DIAGNOSIS — O480 Post-term pregnancy: Principal | ICD-10-CM | POA: Diagnosis present

## 2017-02-05 DIAGNOSIS — Z3A41 41 weeks gestation of pregnancy: Secondary | ICD-10-CM

## 2017-02-05 DIAGNOSIS — O99334 Smoking (tobacco) complicating childbirth: Secondary | ICD-10-CM | POA: Diagnosis present

## 2017-02-05 DIAGNOSIS — F1110 Opioid abuse, uncomplicated: Secondary | ICD-10-CM | POA: Diagnosis present

## 2017-02-05 DIAGNOSIS — E669 Obesity, unspecified: Secondary | ICD-10-CM | POA: Diagnosis present

## 2017-02-05 DIAGNOSIS — Z3490 Encounter for supervision of normal pregnancy, unspecified, unspecified trimester: Secondary | ICD-10-CM

## 2017-02-05 LAB — CBC
HEMATOCRIT: 36.4 % (ref 36.0–46.0)
Hemoglobin: 12 g/dL (ref 12.0–15.0)
MCH: 27.1 pg (ref 26.0–34.0)
MCHC: 33 g/dL (ref 30.0–36.0)
MCV: 82.2 fL (ref 78.0–100.0)
Platelets: 162 10*3/uL (ref 150–400)
RBC: 4.43 MIL/uL (ref 3.87–5.11)
RDW: 13.8 % (ref 11.5–15.5)
WBC: 8.5 10*3/uL (ref 4.0–10.5)

## 2017-02-05 LAB — TYPE AND SCREEN
ABO/RH(D): A POS
Antibody Screen: NEGATIVE

## 2017-02-05 LAB — ABO/RH: ABO/RH(D): A POS

## 2017-02-05 MED ORDER — TERBUTALINE SULFATE 1 MG/ML IJ SOLN
0.2500 mg | Freq: Once | INTRAMUSCULAR | Status: DC | PRN
  Filled 2017-02-05: qty 1

## 2017-02-05 MED ORDER — OXYTOCIN 40 UNITS IN LACTATED RINGERS INFUSION - SIMPLE MED
1.0000 m[IU]/min | INTRAVENOUS | Status: DC
  Administered 2017-02-05: 2 m[IU]/min via INTRAVENOUS

## 2017-02-05 MED ORDER — LACTATED RINGERS IV SOLN: 500.0000 mL | Freq: Once | INTRAVENOUS | Status: DC

## 2017-02-05 MED ORDER — LIDOCAINE HCL (PF) 1 % IJ SOLN
INTRAMUSCULAR | Status: DC | PRN
  Administered 2017-02-05 (×2): 4 mL via EPIDURAL

## 2017-02-05 MED ORDER — OXYCODONE-ACETAMINOPHEN 5-325 MG PO TABS: 1.0000 | ORAL_TABLET | ORAL | Status: DC | PRN

## 2017-02-05 MED ORDER — MISOPROSTOL 25 MCG QUARTER TABLET
25.0000 ug | ORAL_TABLET | ORAL | Status: DC | PRN
  Administered 2017-02-05: 25 ug via VAGINAL
  Filled 2017-02-05: qty 1

## 2017-02-05 MED ORDER — EPHEDRINE 5 MG/ML INJ
10.0000 mg | INTRAVENOUS | Status: DC | PRN
  Filled 2017-02-05: qty 2

## 2017-02-05 MED ORDER — ACETAMINOPHEN 325 MG PO TABS: 650.0000 mg | ORAL_TABLET | ORAL | Status: DC | PRN

## 2017-02-05 MED ORDER — METHADONE HCL 10 MG/ML PO CONC
65.0000 mg | Freq: Every day | ORAL | Status: DC
  Administered 2017-02-06: 65 mg via ORAL
  Filled 2017-02-05: qty 6.5

## 2017-02-05 MED ORDER — PHENYLEPHRINE 40 MCG/ML (10ML) SYRINGE FOR IV PUSH (FOR BLOOD PRESSURE SUPPORT)
80.0000 ug | PREFILLED_SYRINGE | INTRAVENOUS | Status: DC | PRN
  Filled 2017-02-05: qty 5
  Filled 2017-02-05 (×2): qty 10

## 2017-02-05 MED ORDER — LACTATED RINGERS IV SOLN
500.0000 mL | INTRAVENOUS | Status: DC | PRN
Start: 2017-02-05 — End: 2017-02-06
  Administered 2017-02-05: 1000 mL via INTRAVENOUS

## 2017-02-05 MED ORDER — OXYTOCIN BOLUS FROM INFUSION
500.0000 mL | Freq: Once | INTRAVENOUS | Status: AC
  Administered 2017-02-06: 500 mL via INTRAVENOUS

## 2017-02-05 MED ORDER — OXYTOCIN 40 UNITS IN LACTATED RINGERS INFUSION - SIMPLE MED
2.5000 [IU]/h | INTRAVENOUS | Status: DC
  Filled 2017-02-05: qty 1000

## 2017-02-05 MED ORDER — LACTATED RINGERS IV SOLN
INTRAVENOUS | Status: DC
  Administered 2017-02-05: 20:00:00 via INTRAVENOUS
  Administered 2017-02-05: 125 mL/h via INTRAVENOUS
  Administered 2017-02-06 (×2): via INTRAVENOUS

## 2017-02-05 MED ORDER — MISOPROSTOL 200 MCG PO TABS
50.0000 ug | ORAL_TABLET | ORAL | Status: DC | PRN
  Administered 2017-02-05: 50 ug via ORAL
  Filled 2017-02-05: qty 1

## 2017-02-05 MED ORDER — FENTANYL 2.5 MCG/ML BUPIVACAINE 1/10 % EPIDURAL INFUSION (WH - ANES)
14.0000 mL/h | INTRAMUSCULAR | Status: DC | PRN
  Administered 2017-02-05 – 2017-02-06 (×3): 14 mL/h via EPIDURAL
  Filled 2017-02-05 (×3): qty 100

## 2017-02-05 MED ORDER — DIPHENHYDRAMINE HCL 50 MG/ML IJ SOLN: 12.5000 mg | INTRAMUSCULAR | Status: DC | PRN

## 2017-02-05 MED ORDER — ONDANSETRON HCL 4 MG/2ML IJ SOLN: 4.0000 mg | Freq: Four times a day (QID) | INTRAMUSCULAR | Status: DC | PRN

## 2017-02-05 MED ORDER — SOD CITRATE-CITRIC ACID 500-334 MG/5ML PO SOLN: 30.0000 mL | ORAL | Status: DC | PRN

## 2017-02-05 MED ORDER — OXYCODONE-ACETAMINOPHEN 5-325 MG PO TABS: 2.0000 | ORAL_TABLET | ORAL | Status: DC | PRN

## 2017-02-05 MED ORDER — LIDOCAINE HCL (PF) 1 % IJ SOLN
30.0000 mL | INTRAMUSCULAR | Status: DC | PRN
  Filled 2017-02-05: qty 30

## 2017-02-05 MED ORDER — PHENYLEPHRINE 40 MCG/ML (10ML) SYRINGE FOR IV PUSH (FOR BLOOD PRESSURE SUPPORT)
80.0000 ug | PREFILLED_SYRINGE | INTRAVENOUS | Status: DC | PRN
  Filled 2017-02-05: qty 5

## 2017-02-05 NOTE — Progress Notes (Signed)
LABOR PROGRESS NOTE  Joan Mccarty is a 26 y.o. G2P1001 at 2172w1d  admitted for PDIOL  Subjective: Worsening pain, requesting epidural.   Objective: BP 130/71   Pulse 82   Temp 98.2 F (36.8 C) (Oral)   Resp 18   Ht 5\' 2"  (1.575 m)   Wt 198 lb (89.8 kg)   LMP  (LMP Unknown)   BMI 36.21 kg/m  or  Vitals:   02/05/17 1106 02/05/17 1400 02/05/17 1711 02/05/17 1712  BP: 124/72   130/71  Pulse: 82   82  Resp: 18 18 16 18   Temp:      TempSrc:      Weight:      Height:       NST: 125/mod var/+accel/-decel Ctx q3-4 min  Dilation: 4 Effacement (%): 70 Cervical Position: Middle Station: -3 Presentation: Vertex Exam by:: Dr Doroteo GlassmanPhelps  Labs: Lab Results  Component Value Date   WBC 8.5 02/05/2017   HGB 12.0 02/05/2017   HCT 36.4 02/05/2017   MCV 82.2 02/05/2017   PLT 162 02/05/2017    Patient Active Problem List   Diagnosis Date Noted  . Term pregnancy 02/05/2017  . Seasonal allergic rhinitis due to pollen 12/02/2016  . History of drug abuse 08/13/2016  . Supervision of normal pregnancy, antepartum 07/16/2016    Assessment / Plan: 26 y.o. G2P1001 at 8672w1d here for PDIOL Latent labor  Labor: Progressing normally, plan to start pitocin Fetal Wellbeing: Category 1 Pain Control:  Plan for epidural  Anticipated MOD:  Vaginal  Joan AdaJazma Dashiell Franchino, DO OB Fellow 02/05/2017, 6:38 PM

## 2017-02-05 NOTE — Anesthesia Procedure Notes (Signed)
Epidural Patient location during procedure: OB Start time: 02/05/2017 7:02 PM  Staffing Anesthesiologist: Karna ChristmasELLENDER, RYAN P Performed: anesthesiologist   Preanesthetic Checklist Completed: patient identified, site marked, pre-op evaluation, timeout performed, IV checked, risks and benefits discussed and monitors and equipment checked  Epidural Patient position: sitting Prep: DuraPrep Patient monitoring: heart rate, cardiac monitor, continuous pulse ox and blood pressure Approach: midline Location: L3-L4 Injection technique: LOR air  Needle:  Needle type: Tuohy  Needle gauge: 17 G Needle length: 9 cm Needle insertion depth: 6 cm Catheter type: closed end flexible Catheter size: 19 Gauge Catheter at skin depth: 11 cm Test dose: negative and Other  Assessment Events: blood not aspirated, injection not painful, no injection resistance and negative IV test  Additional Notes Informed consent obtained prior to proceeding including risk of failure, 1% risk of PDPH, risk of minor discomfort and bruising.  Discussed rare but serious complications. Discussed alternatives to epidural analgesia and patient desires to proceed.  Timeout performed pre-procedure verifying patient name, procedure, and platelet count.  Patient tolerated procedure well. Reason for block:procedure for pain

## 2017-02-05 NOTE — Progress Notes (Signed)
LABOR PROGRESS NOTE  Joan Mccarty is a 26 y.o. G2P1001 at 5957w1d  admitted for PDIOL  Subjective: Pain controlled, denies HA, blurry vision, RUQ pain, bleeding  Objective: BP 124/72   Pulse 82   Temp 98.2 F (36.8 C) (Oral)   Resp 18   Ht 5\' 2"  (1.575 m)   Wt 89.8 kg (198 lb)   LMP  (LMP Unknown)   BMI 36.21 kg/m  or  Vitals:   02/05/17 0759 02/05/17 1008 02/05/17 1106  BP: 135/87 121/75 124/72  Pulse: 94 82 82  Resp: 16 18 18   Temp: 98.2 F (36.8 C)    TempSrc: Oral    Weight: 89.8 kg (198 lb)    Height: 5\' 2"  (1.575 m)     NST: 120/mod var/+accel/-decel Ctx q3-4 min  Dilation: Fingertip Effacement (%): Thick Cervical Position: Middle Station: -3 Presentation: Vertex Exam by:: Kris HartmannNicole Jones, RN  Labs: Lab Results  Component Value Date   WBC 8.5 02/05/2017   HGB 12.0 02/05/2017   HCT 36.4 02/05/2017   MCV 82.2 02/05/2017   PLT 162 02/05/2017    Patient Active Problem List   Diagnosis Date Noted  . Term pregnancy 02/05/2017  . Seasonal allergic rhinitis due to pollen 12/02/2016  . History of drug abuse 08/13/2016  . Supervision of normal pregnancy, antepartum 07/16/2016    Assessment / Plan: 26 y.o. G2P1001 at 9357w1d here for PDIOL  Labor: latent phase, cytotec for cervical ripening - switch route to buccal Fetal Wellbeing: Category 1 Pain Control:  Labor support without medications Anticipated MOD:  Vaginal  Caryl AdaJazma Nonnie Pickney, DO OB Fellow [facility] 02/05/2017, 1:17 PM

## 2017-02-05 NOTE — H&P (Signed)
LABOR ADMISSION HISTORY AND PHYSICAL  Joan Mccarty is a 26 y.o. female G2P1001 with IUP at [redacted]w[redacted]d by [place] presenting for PDIOL and patient is on methadone. She reports +FMs, No LOF, no VB, no blurry vision, no headaches, no peripheral edema, and no RUQ pain.  She plans on breast feeding. She is unsure for birth control.  Dating: By [place] --->  Estimated Date of Delivery: 01/28/17  Sono:   @[redacted]w[redacted]d , CWD, normal anatomy, cephalic presentation, 2996g, 16% EFW  Prenatal History/Complications:  Past Medical History: Past Medical History:  Diagnosis Date  . Medical history non-contributory     Past Surgical History: Past Surgical History:  Procedure Laterality Date  . wisdom toothh Bilateral     Obstetrical History: OB History    Gravida Para Term Preterm AB Living   2 1 1     1    SAB TAB Ectopic Multiple Live Births           1      Social History: Social History   Social History  . Marital status: Divorced    Spouse name: N/A  . Number of children: N/A  . Years of education: N/A   Social History Main Topics  . Smoking status: Current Some Day Smoker  . Smokeless tobacco: Never Used  . Alcohol use No  . Drug use: Unknown     Comment: Metahdone  . Sexual activity: Yes    Birth control/ protection: None   Other Topics Concern  . Not on file   Social History Narrative  . No narrative on file    Family History: Family History  Problem Relation Age of Onset  . Adopted: Yes    Allergies: Allergies  Allergen Reactions  . Penicillins Hives    Prescriptions Prior to Admission  Medication Sig Dispense Refill Last Dose  . loratadine (CLARITIN) 10 MG tablet Take 1 tablet (10 mg total) by mouth daily. (Patient not taking: Reported on 01/02/2017) 30 tablet 11 Not Taking  . methadone (DOLOPHINE) 10 MG/ML solution Take by mouth every 8 (eight) hours.   Taking  . Prenatal Vit-Fe Fumarate-FA (PRENATAL MULTIVITAMIN) TABS tablet Take 1 tablet by mouth daily at 12 noon.    Taking     Review of Systems   All systems reviewed and negative except as stated in HPI  There were no vitals taken for this visit. General appearance: alert and cooperative Extremities: Homans sign is negative, no sign of DVT, BLLE 1+ pitting edema Presentation: cephalic Fetal monitoringBaseline: 135 bpm, Variability: Good {> 6 bpm), Accelerations: Reactive and Decelerations: Absent Uterine activityNone     Prenatal labs: ABO, Rh: A/Positive/-- (01/23 1018) Antibody: Negative (01/23 1018) Rubella: Non-immune RPR: Non Reactive (05/14 1154)  HBsAg: Negative (01/23 1018)  HIV: Non Reactive (05/14 1154)  GBS:   Negative Fastin: 68, 1hr: 91, 2hr: 80  Prenatal Transfer Tool  Maternal Diabetes: No Genetic Screening: Normal Maternal Ultrasounds/Referrals: Normal Fetal Ultrasounds or other Referrals:  None Maternal Substance Abuse:  Yes:  Type: Methadone Significant Maternal Medications:  None Significant Maternal Lab Results: Lab values include: Group B Strep negative  No results found for this or any previous visit (from the past 24 hour(s)).  Patient Active Problem List   Diagnosis Date Noted  . Term pregnancy 02/05/2017  . Seasonal allergic rhinitis due to pollen 12/02/2016  . History of drug abuse 08/13/2016  . Supervision of normal pregnancy, antepartum 07/16/2016    Assessment: Joan Mccarty is a 26 y.o. G2P1001 at [redacted]w[redacted]d  here for PDIOL, patient on methadone  #Labor: Induction protocol #Pain: Epidural upon request  #FWB: Cat 1 #ID: GBS neg #MOF: Breast #MOC: Undecided #Circ: Inpatient  [place] Shirley, DO Family Medicine Resident PGY-1  02/05/2017, 7:46 AM  CNM attestation:  I have seen and examined this patient; I agree with above documentation in the resident's note.   Joan Mccarty is a 26 y.o. G2P1001 here for IOL due to post dates  PE: BP 117/74   Pulse 86   Temp 98.2 F (36.8 C) (Oral)   Resp 18   Ht 5\' 2"  (1.575 m)   Wt 89.8 kg (198 lb)    LMP  (LMP Unknown)   BMI 36.21 kg/m  Gen: calm comfortable, NAD Resp: normal effort, no distress Abd: gravid  ROS, labs, PMH reviewed  [facility] Plan cytotec for cx ripening, followed by cervical foley/Pit prn Continue daily methadone Anticipate SVD  Cam HaiSHAW, Nimai Burbach CNM 02/05/2017, 9:26 PM

## 2017-02-05 NOTE — Anesthesia Pain Management Evaluation Note (Signed)
  CRNA Pain Management Visit Note  Patient: Joan Mccarty, 26 y.o., female  "Hello I am a [facility] of the [facility]. We have an anesthesia team available at all times to provide care throughout the hospital, including epidural management and anesthesia for C-section. I don't know your plan for the delivery whether it a natural birth, water birth, IV sedation, nitrous supplementation, doula or epidural, but we want to meet your pain goals."   1.Was your pain managed to your expectations on prior hospitalizations?   Yes   2.What is your expectation for pain management during this hospitalization?     Epidural  3.How can we help you reach that goal? Epidural when ready.  Record the patient's initial score and the patient's pain goal.   Pain: 0  Pain Goal: 6 [facility] wants you to be able to say your pain was always managed very well.  Timberlyn Pickford L 02/05/2017

## 2017-02-05 NOTE — Anesthesia Preprocedure Evaluation (Signed)
Anesthesia Evaluation  Patient identified by MRN, date of birth, ID band Patient awake    Reviewed: Allergy & Precautions, H&P , NPO status , Patient's Chart, lab work & pertinent test results  History of Anesthesia Complications Negative for: history of anesthetic complications  Airway Mallampati: II  TM Distance: >3 FB Neck ROM: full    Dental no notable dental hx. (+) Teeth Intact   Pulmonary Current Smoker,    Pulmonary exam normal breath sounds clear to auscultation       Cardiovascular negative cardio ROS Normal cardiovascular exam Rhythm:regular Rate:Normal     Neuro/Psych negative neurological ROS  negative psych ROS   GI/Hepatic negative GI ROS, (+)     substance abuse  ,   Endo/Other  negative endocrine ROS  Renal/GU negative Renal ROS  negative genitourinary   Musculoskeletal   Abdominal (+) + obese,   Peds  Hematology negative hematology ROS (+)   Anesthesia Other Findings   Reproductive/Obstetrics (+) Pregnancy                             Anesthesia Physical Anesthesia Plan  ASA: II  Anesthesia Plan: Epidural   Post-op Pain Management:    Induction:   PONV Risk Score and Plan:   Airway Management Planned:   Additional Equipment:   Intra-op Plan:   Post-operative Plan:   Informed Consent: I have reviewed the patients History and Physical, chart, labs and discussed the procedure including the risks, benefits and alternatives for the proposed anesthesia with the patient or authorized representative who has indicated his/her understanding and acceptance.     Plan Discussed with:   Anesthesia Plan Comments:         Anesthesia Quick Evaluation

## 2017-02-05 NOTE — Progress Notes (Signed)
Patient ID: Quillian Quinceicole Mi, female   DOB: April 07, 1991, 26 y.o.   MRN: [id number] Labor Progress Note  S: Patient seen & examined for progress of labor. Patient comfortable with epidural and trying to get some rest.   O: BP 126/81   Pulse 90   Temp 98.2 F (36.8 C) (Oral)   Resp 16   Ht 5\' 2"  (1.575 m)   Wt 89.8 kg (198 lb)   LMP  (LMP Unknown)   BMI 36.21 kg/m   FHT: 140bpm, mod var, +accels, no decels TOCO: q2-484min, patient looks comfortable during contractions  CVE: Dilation: 4 Effacement (%): 70 Cervical Position: Middle Station: -3 Presentation: Vertex Exam by:: Londell Mohhristy Goodman, RN  A&P: 26 y.o. G2P1001 3037w1d PDIOL with hx of methadone use  Currently not on pitocin, will start pit soon now that she has epidural, s/p cytotec x2 Continue current management Anticipate SVD  [place]ilandJordan Charon Akamine, DO FM Resident PGY-1 02/05/2017 8:51 PM

## 2017-02-06 ENCOUNTER — Encounter (HOSPITAL_COMMUNITY): Payer: Self-pay

## 2017-02-06 DIAGNOSIS — Z3A41 41 weeks gestation of pregnancy: Secondary | ICD-10-CM

## 2017-02-06 DIAGNOSIS — O480 Post-term pregnancy: Secondary | ICD-10-CM

## 2017-02-06 LAB — RPR: RPR Ser Ql: NONREACTIVE

## 2017-02-06 MED ORDER — ONDANSETRON HCL 4 MG PO TABS: 4.0000 mg | ORAL_TABLET | ORAL | Status: DC | PRN

## 2017-02-06 MED ORDER — ZOLPIDEM TARTRATE 5 MG PO TABS
5.0000 mg | ORAL_TABLET | Freq: Every evening | ORAL | Status: DC | PRN
Start: 2017-02-06 — End: 2017-02-08

## 2017-02-06 MED ORDER — DIPHENHYDRAMINE HCL 25 MG PO CAPS: 25.0000 mg | ORAL_CAPSULE | Freq: Four times a day (QID) | ORAL | Status: DC | PRN

## 2017-02-06 MED ORDER — BENZOCAINE-MENTHOL 20-0.5 % EX AERO
1.0000 "application " | INHALATION_SPRAY | CUTANEOUS | Status: DC | PRN
  Administered 2017-02-06: 1 via TOPICAL
  Filled 2017-02-06: qty 56

## 2017-02-06 MED ORDER — TETANUS-DIPHTH-ACELL PERTUSSIS 5-2.5-18.5 LF-MCG/0.5 IM SUSP: 0.5000 mL | Freq: Once | INTRAMUSCULAR | Status: DC

## 2017-02-06 MED ORDER — WITCH HAZEL-GLYCERIN EX PADS: 1.0000 "application " | MEDICATED_PAD | CUTANEOUS | Status: DC | PRN

## 2017-02-06 MED ORDER — MEASLES, MUMPS & RUBELLA VAC ~~LOC~~ INJ
0.5000 mL | INJECTION | Freq: Once | SUBCUTANEOUS | Status: DC
  Filled 2017-02-06: qty 0.5

## 2017-02-06 MED ORDER — DIBUCAINE 1 % RE OINT: 1.0000 "application " | TOPICAL_OINTMENT | RECTAL | Status: DC | PRN

## 2017-02-06 MED ORDER — SENNOSIDES-DOCUSATE SODIUM 8.6-50 MG PO TABS
2.0000 | ORAL_TABLET | ORAL | Status: DC
  Administered 2017-02-06 – 2017-02-08 (×2): 2 via ORAL
  Filled 2017-02-06 (×2): qty 2

## 2017-02-06 MED ORDER — SIMETHICONE 80 MG PO CHEW: 80.0000 mg | CHEWABLE_TABLET | ORAL | Status: DC | PRN

## 2017-02-06 MED ORDER — ACETAMINOPHEN 325 MG PO TABS
650.0000 mg | ORAL_TABLET | ORAL | Status: DC | PRN
  Administered 2017-02-06 – 2017-02-08 (×3): 650 mg via ORAL
  Filled 2017-02-06 (×3): qty 2

## 2017-02-06 MED ORDER — FENTANYL CITRATE (PF) 100 MCG/2ML IJ SOLN: 100.0000 ug | INTRAMUSCULAR | Status: DC | PRN

## 2017-02-06 MED ORDER — COCONUT OIL OIL: 1.0000 "application " | TOPICAL_OIL | Status: DC | PRN

## 2017-02-06 MED ORDER — PRENATAL MULTIVITAMIN CH
1.0000 | ORAL_TABLET | Freq: Every day | ORAL | Status: DC
  Filled 2017-02-06: qty 1

## 2017-02-06 MED ORDER — IBUPROFEN 600 MG PO TABS
600.0000 mg | ORAL_TABLET | Freq: Four times a day (QID) | ORAL | Status: DC
  Administered 2017-02-06 – 2017-02-08 (×7): 600 mg via ORAL
  Filled 2017-02-06 (×8): qty 1

## 2017-02-06 MED ORDER — FENTANYL CITRATE (PF) 100 MCG/2ML IJ SOLN
INTRAMUSCULAR | Status: DC | PRN
  Administered 2017-02-06 (×2): 100 ug via EPIDURAL

## 2017-02-06 MED ORDER — ONDANSETRON HCL 4 MG/2ML IJ SOLN: 4.0000 mg | INTRAMUSCULAR | Status: DC | PRN

## 2017-02-06 MED ORDER — IBUPROFEN 600 MG PO TABS
600.0000 mg | ORAL_TABLET | Freq: Four times a day (QID) | ORAL | Status: DC
  Administered 2017-02-06: 600 mg via ORAL
  Filled 2017-02-06: qty 1

## 2017-02-06 MED ORDER — FENTANYL CITRATE (PF) 100 MCG/2ML IJ SOLN
INTRAMUSCULAR | Status: AC
  Filled 2017-02-06: qty 2

## 2017-02-06 MED ORDER — LIDOCAINE-EPINEPHRINE 2 %-1:100000 IJ SOLN
INTRAMUSCULAR | Status: DC | PRN
  Administered 2017-02-06: 4 mL
  Administered 2017-02-06: 6 mL

## 2017-02-06 NOTE — Lactation Note (Signed)
This note was copied from a baby's chart. Lactation Consultation Note  Patient Name: Joan Quillian Quinceicole Brand XBMWU'XToday's Date: 02/06/2017   Attempted to see mom, mom with visitors from church that just arrived. Lactation will attempt visit at a later time.      Maternal Data    Feeding Feeding Type: Breast Fed  LATCH Score/Interventions                      Lactation Tools Discussed/Used     Consult Status      Ed BlalockSharon S Felecia Stanfill 02/06/2017, 12:40 PM

## 2017-02-06 NOTE — Progress Notes (Signed)
UR chart review completed.  

## 2017-02-06 NOTE — Lactation Note (Signed)
This note was copied from a baby's chart. Lactation Consultation Note  Baby 6 hours old.  P2.  Mother states she was unable to breastfeed first child due to difficulty latching. She pumped for 4 months with her first child.   Mother's nipples invert when compressed.  Reviewed hand expression and mother has good flow of colostrum. Gave drops to baby on spoon. After calming frustrated baby and mother was able to latch baby by using a #24NS. Set up DEBP.  Recommend mother post pump 4-6 times per day for 10-15 min.  Give volume back to baby. Discussed milk storage and cleaning. Mom encouraged to feed baby 8-12 times/24 hours and with feeding cues.  Mom made aware of O/P services, breastfeeding support groups, community resources, and our phone # for post-discharge questions.    Patient Name: Boy Joan Mccarty ZOXWR'UToday's Date: 02/06/2017 Reason for consult: Initial assessment   Maternal Data Has patient been taught Hand Expression?: Yes Does the patient have breastfeeding experience prior to this delivery?: Yes  Feeding Feeding Type: Breast Fed  LATCH Score/Interventions Latch: Repeated attempts needed to sustain latch, nipple held in mouth throughout feeding, stimulation needed to elicit sucking reflex. Intervention(s): Adjust position;Assist with latch;Breast massage;Breast compression  Audible Swallowing: A few with stimulation Intervention(s): Hand expression;Alternate breast massage;Skin to skin  Type of Nipple: Inverted Intervention(s): Hand pump;Double electric pump  Comfort (Breast/Nipple): Soft / non-tender     Hold (Positioning): Assistance needed to correctly position infant at breast and maintain latch.  LATCH Score: 5  Lactation Tools Discussed/Used Tools: Nipple Shields Nipple shield size: 24 Pump Review: Setup, frequency, and cleaning;Milk Storage Initiated by:: Dahlia Byesuth Liliah Dorian RN IBCLC Date initiated:: 02/06/17   Consult Status Consult Status:  Follow-up Date: 02/07/17 Follow-up type: In-patient    Dahlia ByesBerkelhammer, Ane Conerly [facility] 02/06/2017, 3:08 PM

## 2017-02-06 NOTE — Progress Notes (Signed)
Patient ID: Joan Mccarty, female   DOB: May 09, 1991, 26 y.o.   MRN: [id number]  Pt has been complete since MN, and since then has been doing a combination of laboring down, being rebolused due to discomfort, and pushing. Pushing currently has been for a 2hr stretch. VSS, afeb FHR 120s, +accels, no decels Ctx q 2-3 mins with Pit Vtx 0/+1 station  Dr Emelda FearFerguson requested to come eval. VAVD attempt is a possibility; pt is tired and her pushing effort is questionable, but she may be interested after resting. Due to OR situation, pt will rest and at 0630 she will choose if she would like to attempt the vacuum.  If not, then she will be a candidate for C/S.  Cam HaiSHAW, Joan Mccarty 02/06/2017

## 2017-02-06 NOTE — Anesthesia Postprocedure Evaluation (Signed)
Anesthesia Post Note  Patient: Joan Mccarty  Procedure(s) Performed: * No procedures listed *     Patient location during evaluation: [facility] Anesthesia Type: Epidural Level of consciousness: awake Pain management: pain level controlled Vital Signs Assessment: post-procedure vital signs reviewed and stable Respiratory status: spontaneous breathing Cardiovascular status: stable Postop Assessment: no headache, no backache, epidural receding, patient able to bend at knees, no signs of nausea or vomiting and adequate PO intake Anesthetic complications: no    Last Vitals:  Vitals:   02/06/17 0930 02/06/17 1032  BP: 137/84 134/83  Pulse: (!) 102 94  Resp: 18 20  Temp: 37.3 C 37.2 C    Last Pain:  Vitals:   02/06/17 1032  TempSrc: Oral  PainSc:    Pain Goal:                 Joan Mccarty

## 2017-02-07 MED ORDER — METHADONE HCL 10 MG/ML PO CONC
65.0000 mg | Freq: Every day | ORAL | Status: DC
  Administered 2017-02-07 – 2017-02-08 (×2): 65 mg via ORAL
  Filled 2017-02-07 (×2): qty 6.5

## 2017-02-07 NOTE — Clinical Social Work Maternal (Signed)
CLINICAL SOCIAL WORK MATERNAL/CHILD NOTE  Patient Details  Name: Joan Mccarty MRN: [id number] Date of Birth: 06/09/1991  Date:  February 12, 2017  Clinical Social Worker Initiating Note:  Laurey Arrow  Date/ Time Initiated:  08/07/2016/1303     Child's Name:  Joan Mccarty   Legal Guardian:  Mother (FOB is not involved, however, MOB's current boyfriend will be Edwena Felty))   Need for Interpreter:  None   Date of Referral:  2016/09/18     Reason for Referral:  Current Substance Use/Substance Use During Pregnancy  (MOB is currently on Methadone has been for almost 5 years. )   Referral Source:  NICU   Address:  [address]. [place]. [place] [address]  Phone number:  [phone]   Household Members:  Self, Minor Children (MOB's oldest child is Carmin Muskrat 09/06/12)   Natural Supports (not living in the home):  Immediate Family, Friends, Extended Family, Spouse/significant other   Professional Supports: None   Employment: Full-time   Type of Work: [employer].    Education:  High school Herbalist Resources:  Medicaid   Other Resources:      Cultural/Religious Considerations Which May Impact Care:  None Reported  Strengths:  Ability to meet basic needs , Pediatrician chosen , Home prepared for child    Risk Factors/Current Problems:  Substance Use    Cognitive State:  Linear Thinking , Insightful , Goal Oriented    Mood/Affect:  Tearful , Sad , Calm , Anxious , Interested    CSW Assessment: CSW met with MOB to complete an assessment for Methadone treatment. When CSW arrived, bedside nurse was preparing to transfer infant to NICU.  MOB was tearful and expressed feelings of sadness. MOB validated and normalized MOB's thoughts and feelings and assured  MOB it was in the best medical interest for infant; MOB was interesting. Throughout the assessment MOB was tearful but hopeful.  CSW educated MOB about NAS scores and provided MOB with a  brochure. CSW reviewed NICU visitation policy and encouraged MOB to visit as often as she wants.  CSW provided education regarding Baby Blues vs PMADs and provided MOB with information about support groups held at [place] encouraged MOB to evaluate her mental health throughout the postpartum period with the use of the New Mom Checklist developed by Postpartum Progress and notify a medical professional if symptoms arise.  CSW assessed for safety and MOB denied SI, HI, and DV  CSW inquired about MOB's Methadone treatment and MOB reported receiving medication management at [facility].   MOB communicated that MOB has been receiving medication treatment for over 5 years and has never had a negative UDS.  CSW praised MOB for her sobriety and encouraged MOB to keep up the great work. MOB appeared proud of herself as evidence by MOB's facial expression and her openness to share her sobriety story. MOB communicated a willingness to discontinue Methadone and shared her plan to meet her goal.     MOB reviewed the hospital SA policy and procedure and MOB was understanding.  MOB denied having a CPS hx and communicated MOB was not concerned about the results of infant's CDS. MOB was made aware that if infant's CDS is positive without an explanation, CSW will make a report to [place]. MOB reports having all necessary items for infant and feeling prepared to parent.    CSW reviewed SIDS and MOB appeared knowledgeable and was receptive to  the information.  CSW will continue to assess family for needs, barriers, and concerns while infant remains in NICU.   CSW Plan/Description:  Information/Referral to Community Resources , Patient/Family Education , Psychosocial Support and Ongoing Assessment of Needs (CSW will monitor infant's CDS and will make a report if warranted. )   Keysi Oelkers Boyd-Gilyard, MSW, LCSW Clinical Social Work [phone]  Segundo Makela D BOYD-GILYARD,  LCSW 02/07/2017, 1:12 PM 

## 2017-02-07 NOTE — Progress Notes (Signed)
Post Partum Day 1 Subjective: no complaints, up ad lib, voiding, tolerating PO and + flatus ; breastfeeding/pumping; still undecided on contraception; infant to be inpt for NAS observation for multiple days  Objective: Blood pressure 123/80, pulse (!) 101, temperature 98.3 F (36.8 C), temperature source Oral, resp. rate 19, height 5\' 2"  (1.575 m), weight 89.8 kg (198 lb), SpO2 98 %, unknown if currently breastfeeding.  Physical Exam:  General: alert, cooperative and no distress Lochia: appropriate Uterine Fundus: firm Incision: n/a DVT Evaluation: No evidence of DVT seen on physical exam. No significant calf/ankle edema.   Recent Labs  02/05/17 0746  HGB 12.0  HCT 36.4    Assessment/Plan: Plan for discharge tomorrow and Breastfeeding   LOS: 2 days   Joan Mccarty 02/07/2017, 7:29 AM   CNM attestation Post Partum Day #1 I have seen and examined this patient and agree with above documentation in the resident's note.   Joan Mccarty is a 26 y.o. G2P2002 s/p VAVD.  Pt denies problems with ambulating, voiding or po intake. Pain is well controlled.  Plan for birth control is undecided.  Method of Feeding: breast/pump  PE:  BP 123/80 (BP Location: Left Arm)   Pulse (!) 101   Temp 98.3 F (36.8 C) (Oral)   Resp 19   Ht 5\' 2"  (1.575 m)   Wt 89.8 kg (198 lb)   LMP  (LMP Unknown)   SpO2 98%   Breastfeeding? Unknown   BMI 36.21 kg/m  Fundus firm  Plan for discharge: 02/08/17  Cam HaiSHAW, KIMBERLY, CNM 8:24 AM 02/07/2017

## 2017-02-07 NOTE — Lactation Note (Signed)
This note was copied from a baby'Mccarty chart. Lactation Consultation Note  Patient Name: Joan Mccarty UVOZD'GToday'Mccarty Date: 02/07/2017 Reason for consult: Follow-up assessment;NICU baby Baby was transferred to NICU for increasing NAS scores.  Mom states baby hasn't been latching well.  She is pumping with symphony pump every 3 hours and obtaining small amounts of colostrum.  Instructed to pump every 2-3 hours and call for assist/concerns prn.  Discussed obtaining small amounts now and milk coming to volume.  [facility]  Maternal Data    Feeding Feeding Type: Formula Length of feed: 25 min  LATCH Score/Interventions                      Lactation Tools Discussed/Used WIC Program: No Pump Review: Setup, frequency, and cleaning;Milk Storage Initiated by:: RN Date initiated:: 02/07/17   Consult Status Consult Status: Follow-up Date: 02/08/17 Follow-up type: In-patient    Joan Mccarty, Joan Mccarty 02/07/2017, 2:47 PM

## 2017-02-08 ENCOUNTER — Encounter: Payer: Self-pay | Admitting: Obstetrics and Gynecology

## 2017-02-08 DIAGNOSIS — Z8759 Personal history of other complications of pregnancy, childbirth and the puerperium: Secondary | ICD-10-CM | POA: Insufficient documentation

## 2017-02-08 MED ORDER — IBUPROFEN 600 MG PO TABS: 600.0000 mg | ORAL_TABLET | Freq: Four times a day (QID) | ORAL | 0 refills | Status: DC

## 2017-02-08 NOTE — Progress Notes (Signed)
Patient requesting DEBP for discharge. Lactation Consultant notified of patient's needs.

## 2017-02-08 NOTE — Lactation Note (Signed)
This note was copied from a baby's chart. Lactation Consultation Note  Patient Name: Boy Quillian Quinceicole Parks IONGE'XToday's Date: 02/08/2017 Reason for consult: Follow-up assessment;NICU baby Infant is 3350 hours old & seen by Lactation for follow-up assessment. Mom reports she has been pumping q 3hrs for 15 mins/ breast and gets ~3315mL each time. Mom reports she prefers to pump each breast individually and is doing hand massage during pumping and is working on hand expression.  [facility] but has Medicaid so will be eligible. WIC loaner pump issued today [phone])- mom paid $30 cash and pump will be due back by 02/18/17. [facility] and encouraged mom to call on Monday to set up appointment. Encouraged mom to continue pumping q 3hrs for 15-20 mins followed by hand expression. Encouraged mom to do skin-to-skin and BF when in the NICU as able. Mom reports no questions at this time. Encouraged mom to call o/p lactation if she has any later.  Maternal Data    Feeding    LATCH Score/Interventions                      Lactation Tools Discussed/Used WIC Program: No (has medicaid; will call Mon to apply)   Consult Status Consult Status: Complete    Oneal GroutLaura C Christabella Alvira 02/08/2017, 10:53 AM

## 2017-02-08 NOTE — Discharge Instructions (Signed)

## 2017-02-08 NOTE — Discharge Summary (Signed)
OB Discharge Summary     Patient Name: Joan Mccarty DOB: 1991/02/16 MRN: [id number]  Date of admission: 02/05/2017 Delivering MD: Tilda BurrowFERGUSON, JOHN V   Date of discharge: 02/08/2017  Admitting diagnosis: INDUCTION Intrauterine pregnancy: 351w2d     Secondary diagnosis:  Active Problems:   Term pregnancy  Additional problems:  Patient Active Problem List   Diagnosis Date Noted  . Term pregnancy 02/05/2017  . Seasonal allergic rhinitis due to pollen 12/02/2016  . History of drug abuse 08/13/2016  . Supervision of normal pregnancy, antepartum 07/16/2016        Discharge diagnosis: Term Pregnancy Delivered                                                                                                Post partum procedures:none  Augmentation: Pitocin and Cytotec  Complications: Vacuum extraction with 2 min shoulder dystocia with left mediolateral episitomy  Hospital course:  Induction of Labor With Vaginal Delivery   26 y.o. yo [id number] at 526w2d was admitted to the hospital 02/05/2017 for induction of labor.  Indication for induction: Postdates.  Patient had an uncomplicated labor course as follows: Membrane Rupture Time/Date: 9:35 PM ,02/05/2017   Intrapartum Procedures: Episiotomy: Left Mediolateral [3]                                         Lacerations:     Patient had delivery of a Viable infant.  Information for the patient's newborn:  Lelon MastBartage, Boy Demya [[id number]][[id number]]  Delivery Method: Vaginal, Vacuum (Extractor) (Filed from Delivery Summary)   02/06/2017  Details of delivery can be found in separate delivery note.  Patient had a routine postpartum course. Patient is discharged home 02/08/17.  Physical exam  Vitals:   02/06/17 1352 02/06/17 1756 02/07/17 1921 02/08/17 0544  BP: 123/79 123/80 131/67 (!) 124/58  Pulse: 95 (!) 101 100 78  Resp: 19 19 18    Temp: 98.2 F (36.8 C) 98.3 F (36.8 C) 98.3 F (36.8 C) 98.2 F (36.8 C)  TempSrc: Oral Oral Oral Oral  SpO2:       Weight:      Height:       General: alert, cooperative and no distress Lochia: appropriate Uterine Fundus: firm Incision: N/A DVT Evaluation: No evidence of DVT seen on physical exam. Negative Homan's sign. No significant calf/ankle edema. Labs: Lab Results  Component Value Date   WBC 8.5 02/05/2017   HGB 12.0 02/05/2017   HCT 36.4 02/05/2017   MCV 82.2 02/05/2017   PLT 162 02/05/2017   No flowsheet data found.  Discharge instruction: per After Visit Summary and "Baby and Me Booklet".  After visit meds:  Allergies as of 02/08/2017      Reactions   Penicillins Hives   Has patient had a PCN reaction causing immediate rash, facial/tongue/throat swelling, SOB or lightheadedness with hypotension: Yes Has patient had a PCN reaction causing severe rash involving mucus membranes or skin necrosis: No Has patient had a PCN reaction that required hospitalization: No  Has patient had a PCN reaction occurring within the last 10 years: Unknown If all of the above answers are "NO", then may proceed with Cephalosporin use.      Medication List    TAKE these medications   ibuprofen 600 MG tablet Commonly known as:  ADVIL,MOTRIN Take 1 tablet (600 mg total) by mouth every 6 (six) hours.   loratadine 10 MG tablet Commonly known as:  CLARITIN Take 1 tablet (10 mg total) by mouth daily.   methadone 10 MG/ML solution Commonly known as:  DOLOPHINE Take 65 mg by mouth daily.   prenatal multivitamin Tabs tablet Take 1 tablet by mouth daily at 12 noon.       Diet: routine diet  Activity: Advance as tolerated. Pelvic rest for 6 weeks.   Outpatient follow up:4-6 weeks Follow up Appt:No future appointments. Follow up Visit:No Follow-up on file.  Postpartum contraception: Undecided  Newborn Data: Live born female  Birth Weight: 8 lb 14.5 oz (4040 g) APGAR: 8, 9  Baby Feeding: Breast Disposition:NICU   02/08/2017 [place] Shirley, DO  OB FELLOW DISCHARGE ATTESTATION  I have  seen and examined this patient and agree with above documentation in the resident's note.   Frederik Pear, MD OB Fellow 9:18 AM

## 2017-02-12 ENCOUNTER — Ambulatory Visit: Payer: Medicaid Other | Admitting: Certified Nurse Midwife

## 2017-03-10 ENCOUNTER — Ambulatory Visit: Payer: Self-pay

## 2017-03-10 ENCOUNTER — Ambulatory Visit: Payer: Medicaid Other | Admitting: Obstetrics

## 2017-03-10 NOTE — Lactation Note (Signed)
This note was copied from a baby's chart. Lactation Consultation Note  Patient Name: Joan Mccarty GYIRS'W Date: 03/10/2017   NICU baby 65 weeks old. Called to discussed how to pump in order to collect hindmilk to offer baby d/t breast milk seeming "thin" and baby wanting larger volumes--up to 120 ml. Enc mom to collect and label 'foremilk' by pumping for first 2-3 minutes after her initial letdown/flow. Enc mom to then switch bottles and continue pumping to collect the more fatty milk--placing 'hindmilk' on the label for that milk. Mom seemed confused by the request at first, but with the assistance of Olegario Messier, [place], mom states that she understands and will ask if she has any further questions.      Maternal Data    Feeding    LATCH Score                   Interventions    Lactation Tools Discussed/Used     Consult Status      Joan Mccarty 03/10/2017, 9:05 AM

## 2017-11-12 ENCOUNTER — Emergency Department (HOSPITAL_COMMUNITY)
Admission: EM | Admit: 2017-11-12 | Discharge: 2017-11-12 | Disposition: A | Discharge status: Home / Self Care | Payer: Medicaid Other | Attending: Emergency Medicine | Admitting: Emergency Medicine

## 2017-11-12 ENCOUNTER — Encounter: Payer: Self-pay | Admitting: Emergency Medicine

## 2017-11-12 ENCOUNTER — Emergency Department (HOSPITAL_COMMUNITY): Payer: Medicaid Other

## 2017-11-12 DIAGNOSIS — E876 Hypokalemia: Secondary | ICD-10-CM | POA: Diagnosis not present

## 2017-11-12 DIAGNOSIS — F17200 Nicotine dependence, unspecified, uncomplicated: Secondary | ICD-10-CM | POA: Insufficient documentation

## 2017-11-12 DIAGNOSIS — Z79899 Other long term (current) drug therapy: Secondary | ICD-10-CM | POA: Diagnosis not present

## 2017-11-12 DIAGNOSIS — R519 Headache, unspecified: Secondary | ICD-10-CM

## 2017-11-12 DIAGNOSIS — R319 Hematuria, unspecified: Secondary | ICD-10-CM

## 2017-11-12 DIAGNOSIS — R51 Headache: Secondary | ICD-10-CM | POA: Diagnosis present

## 2017-11-12 DIAGNOSIS — N390 Urinary tract infection, site not specified: Secondary | ICD-10-CM

## 2017-11-12 LAB — URINALYSIS, ROUTINE W REFLEX MICROSCOPIC
BILIRUBIN URINE: NEGATIVE
Glucose, UA: NEGATIVE mg/dL
Ketones, ur: NEGATIVE mg/dL
Nitrite: NEGATIVE
Protein, ur: 30 mg/dL — AB
SPECIFIC GRAVITY, URINE: 1.012 (ref 1.005–1.030)
WBC, UA: >50 WBC/hpf — ABNORMAL HIGH (ref 0–5)
pH: 6 (ref 5.0–8.0)

## 2017-11-12 LAB — WET PREP, GENITAL
Sperm: NONE SEEN
Trich, Wet Prep: NONE SEEN
Yeast Wet Prep HPF POC: NONE SEEN

## 2017-11-12 LAB — COMPREHENSIVE METABOLIC PANEL
ALK PHOS: 58 U/L (ref 38–126)
ALT: 12 U/L — AB (ref 14–54)
AST: 12 U/L — ABNORMAL LOW (ref 15–41)
Albumin: 3.8 g/dL (ref 3.5–5.0)
Anion gap: 11 (ref 5–15)
BUN: 5 mg/dL — AB (ref 6–20)
CHLORIDE: 99 mmol/L — AB (ref 101–111)
CO2: 23 mmol/L (ref 22–32)
CREATININE: 0.77 mg/dL (ref 0.44–1.00)
Calcium: 8.5 mg/dL — ABNORMAL LOW (ref 8.9–10.3)
GFR calc Af Amer: >60 mL/min (ref >60)
Glucose, Bld: 105 mg/dL — ABNORMAL HIGH (ref 65–99)
Potassium: 3.3 mmol/L — ABNORMAL LOW (ref 3.5–5.1)
SODIUM: 133 mmol/L — AB (ref 135–145)
Total Bilirubin: 0.6 mg/dL (ref 0.3–1.2)
Total Protein: 7.5 g/dL (ref 6.5–8.1)

## 2017-11-12 LAB — CBC WITH DIFFERENTIAL/PLATELET
Basophils Absolute: 0 10*3/uL (ref 0.0–0.1)
Basophils Relative: 0 %
EOS ABS: 0 10*3/uL (ref 0.0–0.7)
EOS PCT: 0 %
HCT: 40.7 % (ref 36.0–46.0)
Hemoglobin: 13.7 g/dL (ref 12.0–15.0)
LYMPHS ABS: 1.8 10*3/uL (ref 0.7–4.0)
Lymphocytes Relative: 21 %
MCH: 29.7 pg (ref 26.0–34.0)
MCHC: 33.7 g/dL (ref 30.0–36.0)
MCV: 88.3 fL (ref 78.0–100.0)
MONOS PCT: 10 %
Monocytes Absolute: 0.8 10*3/uL (ref 0.1–1.0)
Neutro Abs: 5.7 10*3/uL (ref 1.7–7.7)
Neutrophils Relative %: 69 %
PLATELETS: 203 10*3/uL (ref 150–400)
RBC: 4.61 MIL/uL (ref 3.87–5.11)
RDW: 12.8 % (ref 11.5–15.5)
WBC: 8.3 10*3/uL (ref 4.0–10.5)

## 2017-11-12 LAB — I-STAT BETA HCG BLOOD, ED (MC, WL, AP ONLY)

## 2017-11-12 LAB — I-STAT CG4 LACTIC ACID, ED: LACTIC ACID, VENOUS: 1.18 mmol/L (ref 0.5–1.9)

## 2017-11-12 MED ORDER — CEPHALEXIN 500 MG PO CAPS: 500.0000 mg | ORAL_CAPSULE | Freq: Three times a day (TID) | ORAL | 0 refills | Status: DC

## 2017-11-12 MED ORDER — POTASSIUM CHLORIDE ER 10 MEQ PO TBCR: 10.0000 meq | EXTENDED_RELEASE_TABLET | Freq: Every day | ORAL | 0 refills | Status: DC

## 2017-11-12 MED ORDER — FLUCONAZOLE 150 MG PO TABS
150.0000 mg | ORAL_TABLET | Freq: Once | ORAL | Status: AC
  Administered 2017-11-12: 150 mg via ORAL
  Filled 2017-11-12: qty 1

## 2017-11-12 MED ORDER — FLUCONAZOLE 150 MG PO TABS: 150.0000 mg | ORAL_TABLET | Freq: Every day | ORAL | 0 refills | Status: AC

## 2017-11-12 MED ORDER — DIPHENHYDRAMINE HCL 25 MG PO CAPS
25.0000 mg | ORAL_CAPSULE | Freq: Once | ORAL | Status: AC
  Administered 2017-11-12: 25 mg via ORAL
  Filled 2017-11-12: qty 1

## 2017-11-12 MED ORDER — OXYCODONE-ACETAMINOPHEN 5-325 MG PO TABS
1.0000 | ORAL_TABLET | ORAL | Status: DC | PRN
  Administered 2017-11-12: 1 via ORAL
  Filled 2017-11-12: qty 1

## 2017-11-12 MED ORDER — METOCLOPRAMIDE HCL 10 MG PO TABS
5.0000 mg | ORAL_TABLET | Freq: Once | ORAL | Status: AC
  Administered 2017-11-12: 5 mg via ORAL
  Filled 2017-11-12: qty 1

## 2017-11-12 MED ORDER — KETOROLAC TROMETHAMINE 30 MG/ML IJ SOLN
30.0000 mg | Freq: Once | INTRAMUSCULAR | Status: AC
  Administered 2017-11-12: 30 mg via INTRAMUSCULAR
  Filled 2017-11-12: qty 1

## 2017-11-12 NOTE — ED Triage Notes (Signed)
Pt presents for evaluation of ongoing headache and migraine. States has noticed some blood when wiping in bathroom but denies vaginal bleeding, hematuria, or rectal bleeding. Pt also endorses lower back pain.

## 2017-11-12 NOTE — ED Provider Notes (Signed)
[facility] EMERGENCY DEPARTMENT Provider Note   CSN: [id number] Arrival date & time: 11/12/17  0932     History   Chief Complaint Chief Complaint  Patient presents with  . Migraine  . Back Pain    HPI Demetrica Zipp is a 27 y.o. female.  HPI   27 year old female presents today with multiple complaints.  Patient reports generalized headache for the last 3 days, she denies any significant fever, neck stiffness or neurological deficits.  She denies any trauma to the head.  Patient also notes some right lower back pain nonradiating with no distal neurological deficits.  Patient denies any abdominal pain, discharge or dysuria.  She denies any changes in her bowel or bladder habits.  Patient does note body aches as well.  Tylenol at home with no improvement in symptoms.  Patient notes that she was wiping and noted blood on the toilet paper uncertain if this was from her urine or her vagina.  Past Medical History:  Diagnosis Date  . Medical history non-contributory     Patient Active Problem List   Diagnosis Date Noted  . History of shoulder dystocia in prior pregnancy 02/08/2017  . Term pregnancy 02/05/2017  . Seasonal allergic rhinitis due to pollen 12/02/2016  . History of drug abuse 08/13/2016  . Supervision of normal pregnancy, antepartum 07/16/2016    Past Surgical History:  Procedure Laterality Date  . wisdom toothh Bilateral      OB History    Gravida  2   Para  2   Term  2   Preterm      AB      Living  2     SAB      TAB      Ectopic      Multiple  0   Live Births  2            Home Medications    Prior to Admission medications   Medication Sig Start Date End Date Taking? Authorizing Provider  acetaminophen (TYLENOL) 500 MG tablet Take 500 mg by mouth every 6 (six) hours as needed for mild pain.   Yes [provider]  ibuprofen (ADVIL,MOTRIN) 200 MG tablet Take 200 mg by mouth every 6 (six) hours as needed  for moderate pain.   Yes [provider]  cephALEXin (KEFLEX) 500 MG capsule Take 1 capsule (500 mg total) by mouth 3 (three) times daily. 11/12/17   Afshin Chrystal, Tinnie Gens, PA-C  fluconazole (DIFLUCAN) 150 MG tablet Take 1 tablet (150 mg total) by mouth daily for 1 day. 11/12/17 11/13/17  Franci Oshana, Tinnie Gens, PA-C  ibuprofen (ADVIL,MOTRIN) 600 MG tablet Take 1 tablet (600 mg total) by mouth every 6 (six) hours. 02/08/17   Shirley, [place], DO  loratadine (CLARITIN) 10 MG tablet Take 1 tablet (10 mg total) by mouth daily. Patient not taking: Reported on 01/02/2017 12/02/16   Brock Bad, MD  potassium chloride (K-DUR) 10 MEQ tablet Take 1 tablet (10 mEq total) by mouth daily. 11/12/17   Eyvonne Mechanic, PA-C    Family History Family History  Adopted: Yes    Social History Social History   Tobacco Use  . Smoking status: Current Some Day Smoker    Packs/day: 0.50  . Smokeless tobacco: Never Used  Substance Use Topics  . Alcohol use: No  . Drug use: Not on file    Comment: Metahdone     Allergies   Penicillins   Review of Systems Review of Systems  All other systems reviewed and are negative.    Physical Exam Updated Vital Signs BP 112/72 (BP Location: Left Arm)   Pulse 98   Temp 99.2 F (37.3 C) (Oral)   Resp 15   LMP 09/14/2017 (Approximate)   SpO2 94%   Physical Exam  Constitutional: She is oriented to person, place, and time. She appears well-developed and well-nourished.  HENT:  Head: Normocephalic and atraumatic.  Eyes: Pupils are equal, round, and reactive to light. Conjunctivae are normal. Right eye exhibits no discharge. Left eye exhibits no discharge. No scleral icterus.  Neck: Normal range of motion. Neck supple. No JVD present. No tracheal deviation present.  Neck supple full active range of motion  Pulmonary/Chest: Effort normal. No stridor.  Abdominal: Soft. She exhibits no distension and no mass. There is no tenderness. There is no rebound and no  guarding. No hernia.  Genitourinary:  Genitourinary Comments: Small amount of blood in the vaginal vault, no lacerations, exam uncomfortable, no cervical motion tenderness adnexal masses, no significant discharge  Musculoskeletal:  No CT or L-spine tenderness to palpation-no significant muscular tenderness to palpation, distal sensation strength and motor function intact  Neurological: She is alert and oriented to person, place, and time. No cranial nerve deficit or sensory deficit. She exhibits normal muscle tone. Coordination normal.  Psychiatric: She has a normal mood and affect. Her behavior is normal. Judgment and thought content normal.  Nursing note and vitals reviewed.    ED Treatments / Results  Labs (all labs ordered are listed, but only abnormal results are displayed) Labs Reviewed  WET PREP, GENITAL - Abnormal; Notable for the following components:      Result Value   Clue Cells Wet Prep HPF POC PRESENT (*)    WBC, Wet Prep HPF POC MANY (*)    All other components within normal limits  URINALYSIS, ROUTINE W REFLEX MICROSCOPIC - Abnormal; Notable for the following components:   APPearance HAZY (*)    Hgb urine dipstick LARGE (*)    Protein, ur 30 (*)    Leukocytes, UA MODERATE (*)    WBC, UA >50 (*)    Bacteria, UA FEW (*)    All other components within normal limits  COMPREHENSIVE METABOLIC PANEL - Abnormal; Notable for the following components:   Sodium 133 (*)    Potassium 3.3 (*)    Chloride 99 (*)    Glucose, Bld 105 (*)    BUN 5 (*)    Calcium 8.5 (*)    AST 12 (*)    ALT 12 (*)    All other components within normal limits  URINE CULTURE  CBC WITH DIFFERENTIAL/PLATELET  I-STAT CG4 LACTIC ACID, ED  I-STAT BETA HCG BLOOD, ED ([facility], [facility], [facility] ONLY)  GC/CHLAMYDIA PROBE AMP ([place]) NOT AT [facility]    EKG None  Radiology Ct Head Wo Contrast  Result Date: 11/12/2017 CLINICAL DATA:  Migraine headache. EXAM: CT HEAD WITHOUT CONTRAST TECHNIQUE: Contiguous axial  images were obtained from the base of the skull through the vertex without intravenous contrast. COMPARISON:  None. FINDINGS: Brain: No evidence of acute infarction, hemorrhage, hydrocephalus, extra-axial collection or mass lesion/mass effect. Vascular: No hyperdense vessel or unexpected calcification. Skull: Normal. Negative for fracture or focal lesion. Sinuses/Orbits: No acute finding. Other: None. IMPRESSION: Normal head CT. Electronically Signed   By: Lupita Raider, M.D.   On: 11/12/2017 15:38    Procedures Procedures (including critical care time)  Medications Ordered in ED Medications  ketorolac (  TORADOL) 30 MG/ML injection 30 mg (30 mg Intramuscular Given 11/12/17 1601)  metoCLOPramide (REGLAN) tablet 5 mg (5 mg Oral Given 11/12/17 1601)  diphenhydrAMINE (BENADRYL) capsule 25 mg (25 mg Oral Given 11/12/17 1601)  fluconazole (DIFLUCAN) tablet 150 mg (150 mg Oral Given 11/12/17 1738)     Initial Impression / Assessment and Plan / ED Course  I have reviewed the triage vital signs and the nursing notes.  Pertinent labs & imaging results that were available during my care of the patient were reviewed by me and considered in my medical decision making (see chart for details).      Final Clinical Impressions(s) / ED Diagnoses   Final diagnoses:  Urinary tract infection with hematuria, site unspecified  Hypokalemia  Acute nonintractable headache, unspecified headache type    27 year old female presents today with numerous complaints.  Patient is having headache this appears to be uncomplicated but new for the patient, head CT normal, no neurological deficits improved with medication.  No signs of infection, no concern for meningitis.  Patient also having blood when wiping, question urinary tract infection in this patient given her urinalysis.  Patient did have yeast noted in her urine, I have lower suspicion for acute urinary tract infection caused by yeast.  Patient will be given a dose  of fluconazole here, discharged on antibiotics.  If after antibiotics symptoms continue to persist patient will follow-up for reevaluation, or return immediately with any new or worsening signs or symptoms.  Patient verbalized understanding and agreement to today's plan had no further questions or concerns the time discharge.  Patient also noted to be hypokalemic here discharged home with potassium and encouraged to increase potassium rich foods.    ED Discharge Orders        Ordered    fluconazole (DIFLUCAN) 150 MG tablet  Daily     11/12/17 1700    cephALEXin (KEFLEX) 500 MG capsule  3 times daily     11/12/17 1700    potassium chloride (K-DUR) 10 MEQ tablet  Daily     11/12/17 1733       Rosalio LoudHedges, Nasir Bright, PA-C 11/12/17 2211    Benjiman CorePickering, Nathan, MD 11/13/17 [phone]

## 2017-11-12 NOTE — ED Notes (Signed)
Pt returned to room  

## 2017-11-12 NOTE — ED Notes (Signed)
Patient transported to CT 

## 2017-11-12 NOTE — Discharge Instructions (Addendum)
Please read attached information. If you experience any new or worsening signs or symptoms please return to the emergency room for evaluation. Please follow-up with your primary care provider or specialist as discussed. Please use medication prescribed only as directed and discontinue taking if you have any concerning signs or symptoms.   °

## 2017-11-13 LAB — GC/CHLAMYDIA PROBE AMP (~~LOC~~) NOT AT ARMC
Chlamydia: NEGATIVE
Neisseria Gonorrhea: NEGATIVE

## 2017-11-14 LAB — URINE CULTURE: Culture: >=100000 — AB

## 2017-11-15 ENCOUNTER — Telehealth: Payer: Self-pay

## 2017-11-15 NOTE — Telephone Encounter (Signed)
Post ED Visit - Positive Culture Follow-up  Culture report reviewed by antimicrobial stewardship pharmacist:   Enzo Bi, Pharm.D.  Celedonio Miyamoto, [address].D., BCPS AQ-ID  Garvin Fila, Pharm.D., BCPS  Georgina Pillion, Pharm.D., BCPS  [place], [address].D., BCPS, AAHIVP  Estella Husk, Pharm.D., BCPS, AAHIVP  Lysle Pearl, PharmD, BCPS  Blake Divine, PharmD  Pollyann Samples, PharmD, BCPS [facility] Pharm D Positive urine culture Treated with Cephalexin, organism sensitive to the same and no further patient follow-up is required at this time.  Jerry Caras 11/15/2017, 9:12 AM

## 2018-02-02 ENCOUNTER — Ambulatory Visit: Payer: Medicaid Other | Admitting: Obstetrics

## 2018-02-02 ENCOUNTER — Encounter: Payer: Self-pay | Admitting: Obstetrics

## 2018-02-02 VITALS — BP 130/82 | HR 97 | Wt 198.0 lb

## 2018-02-02 DIAGNOSIS — Z3201 Encounter for pregnancy test, result positive: Secondary | ICD-10-CM | POA: Diagnosis not present

## 2018-02-02 DIAGNOSIS — N912 Amenorrhea, unspecified: Secondary | ICD-10-CM

## 2018-02-02 LAB — POCT URINE PREGNANCY: Preg Test, Ur: POSITIVE — AB

## 2018-02-02 NOTE — Progress Notes (Signed)
I have personally reviewed and evaluated these lab results as part of my medical decision-making.  I agree with the nurse's assessment and plan.  Brock BadHARLES A. Antuane Eastridge MD 02-02-2018

## 2018-02-02 NOTE — Progress Notes (Signed)
RGYN patient presents for problem visit today. Pt has not had cycle. Pt states she took 3 at home pregnancy test all were positive. Pt states she has no complaints today.   Last pap: 07/16/2016  ZOX:[id number]:unsure  Last seen in office 01/30/17 for ROB appt  Pt was never seen for post partum visit following delivery on 02/06/17.  UPT: POSITIVE

## 2018-02-05 ENCOUNTER — Encounter: Payer: Self-pay | Admitting: Obstetrics

## 2018-02-16 ENCOUNTER — Ambulatory Visit (INDEPENDENT_AMBULATORY_CARE_PROVIDER_SITE_OTHER): Payer: Medicaid Other | Admitting: Obstetrics

## 2018-02-16 ENCOUNTER — Encounter: Payer: Self-pay | Admitting: Obstetrics

## 2018-02-16 ENCOUNTER — Other Ambulatory Visit (HOSPITAL_COMMUNITY)
Admission: RE | Admit: 2018-02-16 | Discharge: 2018-02-16 | Disposition: A | Discharge status: Home / Self Care | Payer: Medicaid Other | Source: Ambulatory Visit | Attending: Obstetrics | Admitting: Obstetrics

## 2018-02-16 VITALS — BP 124/79 | HR 96 | Wt 199.2 lb

## 2018-02-16 DIAGNOSIS — Z3A22 22 weeks gestation of pregnancy: Secondary | ICD-10-CM | POA: Insufficient documentation

## 2018-02-16 DIAGNOSIS — Z3482 Encounter for supervision of other normal pregnancy, second trimester: Secondary | ICD-10-CM

## 2018-02-16 DIAGNOSIS — Z3492 Encounter for supervision of normal pregnancy, unspecified, second trimester: Secondary | ICD-10-CM | POA: Diagnosis not present

## 2018-02-16 DIAGNOSIS — Z3687 Encounter for antenatal screening for uncertain dates: Secondary | ICD-10-CM | POA: Diagnosis not present

## 2018-02-16 DIAGNOSIS — Z3490 Encounter for supervision of normal pregnancy, unspecified, unspecified trimester: Secondary | ICD-10-CM | POA: Insufficient documentation

## 2018-02-16 NOTE — Progress Notes (Signed)
Subjective:    Joan Mccarty is being seen today for her first obstetrical visit.  This is not a planned pregnancy. She is at 4763w1d gestation. Her obstetrical history is significant for obesity and smoker. Relationship with FOB: significant other, living together. Patient does intend to breast feed. Pregnancy history fully reviewed.  The information documented in the HPI was reviewed and verified.  Menstrual History: OB History    Gravida  3   Para  2   Term  2   Preterm      AB      Living  2     SAB      TAB      Ectopic      Multiple  0   Live Births  2            Patient's last menstrual period was 09/14/2017 (approximate).    Past Medical History:  Diagnosis Date  . Medical history non-contributory     Past Surgical History:  Procedure Laterality Date  . wisdom toothh Bilateral      (Not in a hospital admission) Allergies  Allergen Reactions  . Penicillins Hives    Has patient had a PCN reaction causing immediate rash, facial/tongue/throat swelling, SOB or lightheadedness with hypotension: Yes Has patient had a PCN reaction causing severe rash involving mucus membranes or skin necrosis: No Has patient had a PCN reaction that required hospitalization: No Has patient had a PCN reaction occurring within the last 10 years: Unknown If all of the above answers are "NO", then may proceed with Cephalosporin use.    Social History   Tobacco Use  . Smoking status: Current Some Day Smoker    Packs/day: 0.50    Types: Cigarettes  . Smokeless tobacco: Never Used  Substance Use Topics  . Alcohol use: No    Family History  Adopted: Yes     Review of Systems Constitutional: negative for weight loss Gastrointestinal: negative for vomiting Genitourinary:negative for genital lesions and vaginal discharge and dysuria Musculoskeletal:negative for back pain Behavioral/Psych: negative for abusive relationship, depression, illegal drug usage and tobacco use     Objective:    BP 124/79   Pulse 96   Wt 199 lb 3.2 oz (90.4 kg)   LMP 09/14/2017 (Approximate)   BMI 36.43 kg/m  General Appearance:    Alert, cooperative, no distress, appears stated age  Head:    Normocephalic, without obvious abnormality, atraumatic  Eyes:    PERRL, conjunctiva/corneas clear, EOM's intact, fundi    benign, both eyes  Ears:    Normal TM's and external ear canals, both ears  Nose:   Nares normal, septum midline, mucosa normal, no drainage    or sinus tenderness  Throat:   Lips, mucosa, and tongue normal; teeth and gums normal  Neck:   Supple, symmetrical, trachea midline, no adenopathy;    thyroid:  no enlargement/tenderness/nodules; no carotid   bruit or JVD  Back:     Symmetric, no curvature, ROM normal, no CVA tenderness  Lungs:     Clear to auscultation bilaterally, respirations unlabored  Chest Wall:    No tenderness or deformity   Heart:    Regular rate and rhythm, S1 and S2 normal, no murmur, rub   or gallop  Breast Exam:    No tenderness, masses, or nipple abnormality  Abdomen:     Soft, non-tender, bowel sounds active all four quadrants,    no masses, no organomegaly  Genitalia:    Normal  female without lesion, discharge or tenderness  Extremities:   Extremities normal, atraumatic, no cyanosis or edema  Pulses:   2+ and symmetric all extremities  Skin:   Skin color, texture, turgor normal, no rashes or lesions  Lymph nodes:   Cervical, supraclavicular, and axillary nodes normal  Neurologic:   CNII-XII intact, normal strength, sensation and reflexes    throughout      Lab Review Urine pregnancy test Labs reviewed yes Radiologic studies reviewed no Assessment:    Pregnancy at [redacted]w[redacted]d weeks   Plan:     1. Encounter for supervision of normal pregnancy, antepartum, unspecified gravidity Rx - Cytology - PAP - Cervicovaginal ancillary only - Culture, OB Urine - Hemoglobinopathy evaluation - Obstetric Panel, Including HIV ; 2. Unsure of LMP  (last menstrual period) as reason for ultrasound scan Rx: - [place] MFM OB COMP + 14 WK; Future  Prenatal vitamins.  Counseling provided regarding continued use of seat belts, cessation of alcohol consumption, smoking or use of illicit drugs; infection precautions i.e., influenza/TDAP immunizations, toxoplasmosis,CMV, parvovirus, listeria and varicella; workplace safety, exercise during pregnancy; routine dental care, safe medications, sexual activity, hot tubs, saunas, pools, travel, caffeine use, fish and methlymercury, potential toxins, hair treatments, varicose veins Weight gain recommendations per IOM guidelines reviewed: underweight/BMI< 18.5--> gain 28 - 40 lbs; normal weight/BMI 18.5 - 24.9--> gain 25 - 35 lbs; overweight/BMI 25 - 29.9--> gain 15 - 25 lbs; obese/BMI >30->gain  11 - 20 lbs Problem list reviewed and updated. FIRST/CF mutation testing/NIPT/QUAD SCREEN/fragile X/Ashkenazi Jewish population testing/Spinal muscular atrophy discussed: requested. Role of ultrasound in pregnancy discussed; fetal survey: requested. Amniocentesis discussed: not indicated.  No orders of the defined types were placed in this encounter.  Orders Placed This Encounter  Procedures  . Culture, OB Urine  . [place] MFM OB COMP + 14 WK    Standing Status:   Future    Standing Expiration Date:   04/20/2019    Order Specific Question:   Reason for Exam (SYMPTOM  OR DIAGNOSIS REQUIRED)    Answer:   Unsure LMP    Order Specific Question:   Preferred Imaging Location?    Answer:   [facility]  . Hemoglobinopathy evaluation  . Obstetric Panel, Including HIV    Follow up in 4 weeks. 50% of 20 min visit spent on counseling and coordination of care.     Brock Bad MD 02-16-2018

## 2018-02-16 NOTE — Progress Notes (Signed)
Pt has no concerns. This was not a planned pregnancy. Pt does reside with boyfriend/FOB.

## 2018-02-18 ENCOUNTER — Other Ambulatory Visit: Payer: Self-pay | Admitting: Obstetrics

## 2018-02-18 DIAGNOSIS — B9689 Other specified bacterial agents as the cause of diseases classified elsewhere: Secondary | ICD-10-CM

## 2018-02-18 DIAGNOSIS — N760 Acute vaginitis: Principal | ICD-10-CM

## 2018-02-18 LAB — CERVICOVAGINAL ANCILLARY ONLY
BACTERIAL VAGINITIS: POSITIVE — AB
Candida vaginitis: NEGATIVE
Chlamydia: NEGATIVE
Neisseria Gonorrhea: NEGATIVE
TRICH (WINDOWPATH): NEGATIVE

## 2018-02-18 LAB — CYTOLOGY - PAP: Diagnosis: NEGATIVE

## 2018-02-18 MED ORDER — SECNIDAZOLE 2 G PO PACK: 1.0000 | PACK | Freq: Once | ORAL | 2 refills | Status: AC

## 2018-02-19 ENCOUNTER — Other Ambulatory Visit: Payer: Self-pay | Admitting: Obstetrics

## 2018-02-19 ENCOUNTER — Telehealth: Payer: Self-pay

## 2018-02-19 DIAGNOSIS — N3000 Acute cystitis without hematuria: Secondary | ICD-10-CM

## 2018-02-19 LAB — URINE CULTURE, OB REFLEX

## 2018-02-19 LAB — CULTURE, OB URINE

## 2018-02-19 MED ORDER — NITROFURANTOIN MONOHYD MACRO 100 MG PO CAPS: 100.0000 mg | ORAL_CAPSULE | Freq: Two times a day (BID) | ORAL | 0 refills | Status: DC

## 2018-02-19 NOTE — Telephone Encounter (Signed)
Advised of results and rx sent 

## 2018-02-19 NOTE — Telephone Encounter (Signed)
Attempted to contact, no answer, left vm ?

## 2018-02-20 ENCOUNTER — Other Ambulatory Visit (HOSPITAL_COMMUNITY): Payer: Self-pay | Admitting: *Deleted

## 2018-02-20 ENCOUNTER — Other Ambulatory Visit: Payer: Self-pay | Admitting: Obstetrics

## 2018-02-20 ENCOUNTER — Ambulatory Visit (HOSPITAL_COMMUNITY)
Admission: RE | Admit: 2018-02-20 | Discharge: 2018-02-20 | Disposition: A | Discharge status: Home / Self Care | Payer: Medicaid Other | Source: Ambulatory Visit | Attending: Obstetrics | Admitting: Obstetrics

## 2018-02-20 DIAGNOSIS — Z3687 Encounter for antenatal screening for uncertain dates: Secondary | ICD-10-CM

## 2018-02-20 DIAGNOSIS — Z362 Encounter for other antenatal screening follow-up: Secondary | ICD-10-CM

## 2018-02-20 DIAGNOSIS — Z3A13 13 weeks gestation of pregnancy: Secondary | ICD-10-CM

## 2018-03-16 ENCOUNTER — Encounter: Payer: Self-pay | Admitting: Obstetrics

## 2018-03-16 ENCOUNTER — Ambulatory Visit (INDEPENDENT_AMBULATORY_CARE_PROVIDER_SITE_OTHER): Payer: Medicaid Other | Admitting: Obstetrics

## 2018-03-16 VITALS — BP 104/64 | HR 93 | Wt 201.5 lb

## 2018-03-16 DIAGNOSIS — N760 Acute vaginitis: Secondary | ICD-10-CM | POA: Diagnosis not present

## 2018-03-16 DIAGNOSIS — Z3482 Encounter for supervision of other normal pregnancy, second trimester: Secondary | ICD-10-CM

## 2018-03-16 DIAGNOSIS — B9689 Other specified bacterial agents as the cause of diseases classified elsewhere: Secondary | ICD-10-CM

## 2018-03-16 DIAGNOSIS — Z3480 Encounter for supervision of other normal pregnancy, unspecified trimester: Secondary | ICD-10-CM

## 2018-03-16 NOTE — Progress Notes (Signed)
Subjective:  Joan Mccarty is a 27 y.o. G3P2002 at 2834w4d being seen today for ongoing prenatal care.  She is currently monitored for the following issues for this low-risk pregnancy and has Supervision of normal pregnancy, antepartum; History of drug abuse; Seasonal allergic rhinitis due to pollen; Term pregnancy; History of shoulder dystocia in prior pregnancy; and Encounter for supervision of normal pregnancy, unspecified, unspecified trimester on their problem list.  Patient reports itching of palms of hands.  Contractions: Not present. Vag. Bleeding: None.  Movement: Present. Denies leaking of fluid.   The following portions of the patient's history were reviewed and updated as appropriate: allergies, current medications, past family history, past medical history, past social history, past surgical history and problem list. Problem list updated.  Objective:   Vitals:   03/16/18 1048  BP: 104/64  Pulse: 93  Weight: 201 lb 8 oz (91.4 kg)    Fetal Status: Fetal Heart Rate (bpm): 160   Movement: Present     General:  Alert, oriented and cooperative. Patient is in no acute distress.  Skin: Skin is warm and dry. No rash noted.   Cardiovascular: Normal heart rate noted  Respiratory: Normal respiratory effort, no problems with respiration noted  Abdomen: Soft, gravid, appropriate for gestational age. Pain/Pressure: Absent     Pelvic:  Cervical exam deferred        Extremities: Normal range of motion.  Edema: None  Mental Status: Normal mood and affect. Normal behavior. Normal judgment and thought content.   Urinalysis:      US MFM OB Comp Less 14 Wks (Accession [id number]) (Order [id number])  Imaging  Date: 02/20/2018 [facility] Released By: Marcellina MillinMoser, Kelly L, RT Authorizing: Brock BadHarper, Charles A, MD  Exam Information   Status Exam Begun  Exam Ended   Final [99] 02/20/2018 12:48 PM 02/20/2018 1:22 PM  PACS Images   Show images for US MFM OB  Comp Less 14 Wks  Study Result   ----------------------------------------------------------------------  OBSTETRICS REPORT                      (Signed Final 02/23/2018 09:14 am) ---------------------------------------------------------------------- Patient Info  ID #:       [id number]                          D.O.B.:  11/19/1990 (27 yrs)  Name:       Joan QuinceNICOLE Pasion                  Visit Date: 02/20/2018 01:07 pm ---------------------------------------------------------------------- Performed By  Performed By:     Marcellina MillinKelly L Moser          Ref. Address:     [address]                    [address]                                                             [address], [place] [address]  [place], [place]                                                             [address]  Attending:        Noralee Space MD        Location:         [facility]  Referred By:      Brock Bad MD ---------------------------------------------------------------------- Orders   #  Description                                 Code   1  [place] MFM OB COMP LESS THAN 14                 [phone]      WEEKS  ----------------------------------------------------------------------   #  Ordered By               Order #        Accession #    Episode #   1  Coral Ceo           [id number]      [id number]     [id number]  ---------------------------------------------------------------------- Indications   [redacted] weeks gestation of pregnancy                Z3A.13   Encounter for uncertain dates                  Z36.87  ---------------------------------------------------------------------- OB History  Gravidity:    3         Term:   2        Prem:   0        SAB:   0  TOP:          0       Ectopic:  0        Living: 2 ---------------------------------------------------------------------- Fetal Evaluation  Num Of Fetuses:     1  Fetal Heart         157  Rate(bpm):   Cardiac Activity:   Observed  Presentation:       Variable  Placenta:           Anterior  Amniotic Fluid  AFI FV:      Subjectively within normal limits ---------------------------------------------------------------------- Biometry  CRL:        78  mm     G. Age:  13w 4d                  EDD:   08/24/18 ---------------------------------------------------------------------- Gestational Age  LMP:           22w 5d        Date:  09/14/17                 EDD:   06/21/18  Best:          13w 4d     Det. By:  U/S C R L (02/20/18)     EDD:   08/24/18 ---------------------------------------------------------------------- Anatomy  Cranium:  Appears normal         Abdominal Wall:         Appears nml (cord                                                                        insert, abd wall)  Choroid Plexus:        Appears normal         Bladder:                Appears normal  Stomach:               Appears normal, left   Upper Extremities:      Appears normal                         sided  Abdomen:               Appears normal         Lower Extremities:      Appears normal ---------------------------------------------------------------------- Cervix Uterus Adnexa  Cervix  Closed  Uterus  No abnormality visualized.  Left Ovary  Within normal limits.  Right Ovary  Within normal limits.  Cul De Sac:   No free fluid seen.  Adnexa:       No abnormality visualized. ---------------------------------------------------------------------- Impression  Patient is here for ulrasound and has uncertain LMP date.  On ultrasound, the CRL measurement is consistent with 13w  4d and good fetal heart activity is seen. Fetal anatomy that  could be ascertained at this gestational age is normal.  We have assigned the EDD at 08/24/18. ----------------------------------------------------------------------                  Noralee Space, MD Electronically Signed Final Report   02/23/2018 09:14  am ----------------------------------------------------------------------    Assessment and Plan:  Pregnancy: [id number] at [redacted]w[redacted]d  1. Supervision of other normal pregnancy, antepartum Rx: - Obstetric Panel, Including HIV - Hemoglobinopathy evaluation - Cystic Fibrosis Mutation 97 - AFP, Serum, Open Spina Bifida - SMN1 Copy Number Analysis - HgB A1c  2. BV (bacterial vaginosis) Rx: - clindamycin (CLEOCIN) 300 MG capsule; Take 300 mg by mouth 3 (three) times daily.  Preterm labor symptoms and general obstetric precautions including but not limited to vaginal bleeding, contractions, leaking of fluid and fetal movement were reviewed in detail with the patient. Please refer to After Visit Summary for other counseling recommendations.  Return in about 4 weeks (around 04/13/2018) for ROB.   Brock Bad, MD

## 2018-03-16 NOTE — Progress Notes (Signed)
Patient reports good fetal movement, complains of palms of hands itching.

## 2018-04-13 ENCOUNTER — Ambulatory Visit (HOSPITAL_COMMUNITY)
Admission: RE | Admit: 2018-04-13 | Discharge: 2018-04-13 | Disposition: A | Discharge status: Home / Self Care | Payer: Medicaid Other | Source: Ambulatory Visit | Attending: Obstetrics | Admitting: Obstetrics

## 2018-04-13 ENCOUNTER — Other Ambulatory Visit (HOSPITAL_COMMUNITY): Payer: Self-pay | Admitting: Obstetrics and Gynecology

## 2018-04-13 ENCOUNTER — Encounter (HOSPITAL_COMMUNITY): Payer: Self-pay

## 2018-04-13 ENCOUNTER — Encounter: Payer: Medicaid Other | Admitting: Obstetrics

## 2018-04-13 DIAGNOSIS — Z363 Encounter for antenatal screening for malformations: Secondary | ICD-10-CM

## 2018-04-13 DIAGNOSIS — O99332 Smoking (tobacco) complicating pregnancy, second trimester: Secondary | ICD-10-CM

## 2018-04-13 DIAGNOSIS — Z3490 Encounter for supervision of normal pregnancy, unspecified, unspecified trimester: Secondary | ICD-10-CM

## 2018-04-13 DIAGNOSIS — O99212 Obesity complicating pregnancy, second trimester: Secondary | ICD-10-CM | POA: Diagnosis not present

## 2018-04-13 DIAGNOSIS — Z362 Encounter for other antenatal screening follow-up: Secondary | ICD-10-CM

## 2018-04-13 DIAGNOSIS — Z3A21 21 weeks gestation of pregnancy: Secondary | ICD-10-CM | POA: Diagnosis not present

## 2018-04-14 ENCOUNTER — Telehealth: Payer: Self-pay | Admitting: *Deleted

## 2018-04-14 NOTE — Telephone Encounter (Signed)
Lft vmail for patient to callback and reschedule missed OB appt.

## 2018-07-22 NOTE — L&D Delivery Note (Signed)
Patient: Joan Mccarty MRN: [id number]  GBS status: Negative by PCR, IAP given: None   Patient is a 28 y.o. now G3P3 s/p NSVD at [redacted]w[redacted]d, who was admitted for SOL. Unknown time of ROM. Patient did not believe she had ruptured, but no membranes felt prior to delivery. Heavy meconium noted at time of delivery.    Delivery Note At 6:11 AM a viable female was delivered via Vaginal, Spontaneous (Presentation: OA ).  APGAR: 8, 9; weight pending.   Placenta status: spontaneous, intact.  Cord: 3 vessel with the following complications: none.    Anesthesia:  None  Episiotomy: None Lacerations: 1st degree Suture Repair: None Est. Blood Loss (mL): 50  Called to the room when patient noted to be complete and involuntarily pushing. While in room, fetal bradycardia to the 60s noted for approximately 4 minutes. Resolved with O2 and position changes. Dr. Adrian Blackwater called to room and remained for delivery. NICU present for delivery. Head delivered OA. No nuchal cord present. Shoulder and body delivered in usual fashion. Infant with spontaneous cry, placed on mother's abdomen, dried and bulb suctioned. Cord clamped x 2 after 20 second- delay, and cut by physician. Infant taken to the warmer. Cord blood drawn. Placenta delivered spontaneously with gentle cord traction. Placenta sent to pathology. Fundus firm with massage and Pitocin. Perineum inspected and found to have 1st degree laceration, which was found to be hemostatic.  Mom to postpartum.  Baby to Couplet care / Skin to Skin.  De Hollingshead 09/02/2018, 7:03 AM

## 2018-09-02 ENCOUNTER — Encounter (HOSPITAL_COMMUNITY): Payer: Self-pay

## 2018-09-02 ENCOUNTER — Inpatient Hospital Stay (HOSPITAL_COMMUNITY)
Admission: AD | Admit: 2018-09-02 | Discharge: 2018-09-04 | DRG: 806 | Disposition: A | Discharge status: Home / Self Care | Payer: Medicaid Other | Attending: Family Medicine | Admitting: Family Medicine

## 2018-09-02 DIAGNOSIS — F17210 Nicotine dependence, cigarettes, uncomplicated: Secondary | ICD-10-CM | POA: Diagnosis present

## 2018-09-02 DIAGNOSIS — O99334 Smoking (tobacco) complicating childbirth: Secondary | ICD-10-CM | POA: Diagnosis present

## 2018-09-02 DIAGNOSIS — F1120 Opioid dependence, uncomplicated: Secondary | ICD-10-CM | POA: Diagnosis present

## 2018-09-02 DIAGNOSIS — O99324 Drug use complicating childbirth: Secondary | ICD-10-CM | POA: Diagnosis present

## 2018-09-02 DIAGNOSIS — Z880 Allergy status to penicillin: Secondary | ICD-10-CM | POA: Diagnosis not present

## 2018-09-02 DIAGNOSIS — Z3A40 40 weeks gestation of pregnancy: Secondary | ICD-10-CM | POA: Diagnosis not present

## 2018-09-02 DIAGNOSIS — Z3483 Encounter for supervision of other normal pregnancy, third trimester: Secondary | ICD-10-CM | POA: Diagnosis present

## 2018-09-02 DIAGNOSIS — Z3490 Encounter for supervision of normal pregnancy, unspecified, unspecified trimester: Secondary | ICD-10-CM

## 2018-09-02 DIAGNOSIS — O480 Post-term pregnancy: Secondary | ICD-10-CM | POA: Diagnosis not present

## 2018-09-02 LAB — CBC
HCT: 42.4 % (ref 36.0–46.0)
Hemoglobin: 13.9 g/dL (ref 12.0–15.0)
MCH: 28.1 pg (ref 26.0–34.0)
MCHC: 32.8 g/dL (ref 30.0–36.0)
MCV: 85.7 fL (ref 80.0–100.0)
Platelets: 185 10*3/uL (ref 150–400)
RBC: 4.95 MIL/uL (ref 3.87–5.11)
RDW: 14.6 % (ref 11.5–15.5)
WBC: 16.5 10*3/uL — AB (ref 4.0–10.5)
nRBC: 0 % (ref 0.0–0.2)

## 2018-09-02 LAB — RAPID HIV SCREEN (HIV 1/2 AB+AG)
HIV 1/2 Antibodies: NONREACTIVE
HIV-1 P24 Antigen - HIV24: NONREACTIVE

## 2018-09-02 LAB — RAPID URINE DRUG SCREEN, HOSP PERFORMED
AMPHETAMINES: NOT DETECTED
Barbiturates: NOT DETECTED
Benzodiazepines: NOT DETECTED
COCAINE: NOT DETECTED
OPIATES: NOT DETECTED
TETRAHYDROCANNABINOL: NOT DETECTED

## 2018-09-02 LAB — TYPE AND SCREEN
ABO/RH(D): A POS
Antibody Screen: NEGATIVE

## 2018-09-02 LAB — GROUP B STREP BY PCR: Group B strep by PCR: NEGATIVE

## 2018-09-02 LAB — HEMOGLOBIN A1C
Hgb A1c MFr Bld: 5.5 % (ref 4.8–5.6)
Mean Plasma Glucose: 111.15 mg/dL

## 2018-09-02 LAB — HEPATITIS B SURFACE ANTIGEN: Hepatitis B Surface Ag: NEGATIVE

## 2018-09-02 LAB — RPR: RPR Ser Ql: NONREACTIVE

## 2018-09-02 MED ORDER — SOD CITRATE-CITRIC ACID 500-334 MG/5ML PO SOLN: 30.0000 mL | ORAL | Status: DC | PRN

## 2018-09-02 MED ORDER — ONDANSETRON HCL 4 MG PO TABS: 4.0000 mg | ORAL_TABLET | ORAL | Status: DC | PRN

## 2018-09-02 MED ORDER — ZOLPIDEM TARTRATE 5 MG PO TABS: 5.0000 mg | ORAL_TABLET | Freq: Every evening | ORAL | Status: DC | PRN

## 2018-09-02 MED ORDER — OXYTOCIN BOLUS FROM INFUSION
500.0000 mL | Freq: Once | INTRAVENOUS | Status: AC
  Administered 2018-09-02: 500 mL via INTRAVENOUS

## 2018-09-02 MED ORDER — ONDANSETRON HCL 4 MG/2ML IJ SOLN: 4.0000 mg | Freq: Four times a day (QID) | INTRAMUSCULAR | Status: DC | PRN

## 2018-09-02 MED ORDER — OXYTOCIN 40 UNITS IN NORMAL SALINE INFUSION - SIMPLE MED
2.5000 [IU]/h | INTRAVENOUS | Status: DC
  Filled 2018-09-02: qty 1000

## 2018-09-02 MED ORDER — TETANUS-DIPHTH-ACELL PERTUSSIS 5-2.5-18.5 LF-MCG/0.5 IM SUSP: 0.5000 mL | Freq: Once | INTRAMUSCULAR | Status: AC

## 2018-09-02 MED ORDER — ONDANSETRON HCL 4 MG/2ML IJ SOLN: 4.0000 mg | INTRAMUSCULAR | Status: DC | PRN

## 2018-09-02 MED ORDER — LACTATED RINGERS IV SOLN
INTRAVENOUS | Status: DC
  Administered 2018-09-02: 06:00:00 via INTRAVENOUS

## 2018-09-02 MED ORDER — FENTANYL CITRATE (PF) 100 MCG/2ML IJ SOLN: 100.0000 ug | INTRAMUSCULAR | Status: DC | PRN

## 2018-09-02 MED ORDER — OXYCODONE-ACETAMINOPHEN 5-325 MG PO TABS: 2.0000 | ORAL_TABLET | ORAL | Status: DC | PRN

## 2018-09-02 MED ORDER — IBUPROFEN 600 MG PO TABS
600.0000 mg | ORAL_TABLET | Freq: Four times a day (QID) | ORAL | Status: DC
  Administered 2018-09-02 – 2018-09-04 (×9): 600 mg via ORAL
  Filled 2018-09-02 (×9): qty 1

## 2018-09-02 MED ORDER — SENNOSIDES-DOCUSATE SODIUM 8.6-50 MG PO TABS
2.0000 | ORAL_TABLET | ORAL | Status: DC
  Administered 2018-09-02 – 2018-09-03 (×2): 2 via ORAL
  Filled 2018-09-02 (×2): qty 2

## 2018-09-02 MED ORDER — OXYCODONE-ACETAMINOPHEN 5-325 MG PO TABS: 1.0000 | ORAL_TABLET | ORAL | Status: DC | PRN

## 2018-09-02 MED ORDER — SIMETHICONE 80 MG PO CHEW: 80.0000 mg | CHEWABLE_TABLET | ORAL | Status: DC | PRN

## 2018-09-02 MED ORDER — MEASLES, MUMPS & RUBELLA VAC IJ SOLR
0.5000 mL | Freq: Once | INTRAMUSCULAR | Status: DC
  Filled 2018-09-02: qty 0.5

## 2018-09-02 MED ORDER — BENZOCAINE-MENTHOL 20-0.5 % EX AERO: 1.0000 "application " | INHALATION_SPRAY | CUTANEOUS | Status: DC | PRN

## 2018-09-02 MED ORDER — WITCH HAZEL-GLYCERIN EX PADS: 1.0000 "application " | MEDICATED_PAD | CUTANEOUS | Status: DC | PRN

## 2018-09-02 MED ORDER — PRENATAL MULTIVITAMIN CH
1.0000 | ORAL_TABLET | Freq: Every day | ORAL | Status: DC
  Administered 2018-09-02 – 2018-09-04 (×3): 1 via ORAL
  Filled 2018-09-02 (×3): qty 1

## 2018-09-02 MED ORDER — ACETAMINOPHEN 325 MG PO TABS: 650.0000 mg | ORAL_TABLET | ORAL | Status: DC | PRN

## 2018-09-02 MED ORDER — DIPHENHYDRAMINE HCL 25 MG PO CAPS: 25.0000 mg | ORAL_CAPSULE | Freq: Four times a day (QID) | ORAL | Status: DC | PRN

## 2018-09-02 MED ORDER — DIBUCAINE 1 % RE OINT: 1.0000 "application " | TOPICAL_OINTMENT | RECTAL | Status: DC | PRN

## 2018-09-02 MED ORDER — LIDOCAINE HCL (PF) 1 % IJ SOLN
30.0000 mL | INTRAMUSCULAR | Status: DC | PRN
  Filled 2018-09-02: qty 30

## 2018-09-02 MED ORDER — LACTATED RINGERS IV SOLN
500.0000 mL | INTRAVENOUS | Status: DC | PRN
  Administered 2018-09-02: 500 mL via INTRAVENOUS

## 2018-09-02 MED ORDER — COCONUT OIL OIL
1.0000 "application " | TOPICAL_OIL | Status: DC | PRN
  Administered 2018-09-03: 1 via TOPICAL
  Filled 2018-09-02: qty 120

## 2018-09-02 NOTE — Lactation Note (Signed)
This note was copied from a baby's chart. Lactation Consultation Note  Patient Name: Girl Marilou Hirabayashi [id number] Date: 09/02/2018 Reason for consult: Initial assessment;Term  P3 mother whose infant is now 33 hours old.  Mother pumped and bottle fed her first child and breast fed her second child for a few months.  Baby was asleep in father's arms when I arrived.  Visitors present.  Mother had no questions/concerns related to breast feeding at this time.  Encouraged to feed 8-12 times/24 hours or sooner if baby shows feeding cues.  Reviewed feeding cues with mother.  Mother is familiar with hand expression and did not need to have a demonstration of this.  Colostrum container provided and milk storage times reviewed.    Suggested mother call for latch assistance as needed.  Mother does not have a DEBP for home use but had WIC seven months ago.  WIC referral put in so she may be seen tomorrow in the hospital.  If not, mother will obtain a pump after discharge.  Mom made aware of O/P services, breastfeeding support groups, community resources, and our phone # for post-discharge questions.   Maternal Data Formula Feeding for Exclusion: No Has patient been taught Hand Expression?: Yes Does the patient have breastfeeding experience prior to this delivery?: Yes  Feeding Feeding Type: Breast Fed  LATCH Score Latch: Grasps breast easily, tongue down, lips flanged, rhythmical sucking.  Audible Swallowing: Spontaneous and intermittent  Type of Nipple: Everted at rest and after stimulation  Comfort (Breast/Nipple): Soft / non-tender  Hold (Positioning): No assistance needed to correctly position infant at breast.  LATCH Score: 10  Interventions    Lactation Tools Discussed/Used WIC Program: Yes   Consult Status Consult Status: Follow-up Date: 09/03/18 Follow-up type: In-patient    Anwitha Mapes R Miloh Alcocer 09/02/2018, 12:16 PM

## 2018-09-02 NOTE — Consult Note (Signed)
Neonatology Note:   Attendance at Delivery:    I was asked by Dr. Adrian Blackwater to attend this NSVD at 40 6/7 weeks due to [facility] and thick meconium. The mother is a G3P2 A pos, GBS neg with history of opiate addiction, on Methadone. Other prenatal labs are pending. ROM just before delivery, fluid with very thick meconium. Infant with good tone at birth, but with thick meconium coming out of both nostrils and mouth. I bulb suctioned as much as possible before we stimulated the baby, although she was trying to cry on her own. Delayed cord clamping was defferred. We moved her to the warmer, where I bulb suctioned again, getting dark green, thick fluid out. She was crying well by 1 minute. We performed DeLee suctioning and got another 2 ml of thick green mucous out, then did chest PT as the baby was coughing and there were coarse breath sounds. After that, her lungs sounded clear to auscultation and she was pink in room air. Ap 8/9. Infant is able to remain with her mother for skin to skin time under nursing supervision. I asked the supervising nurse to let me know if she had any problems. Transferred to the care of Pediatrician.   Doretha Sou, MD

## 2018-09-02 NOTE — MAU Note (Signed)
PT SAYS  UC'S STRONG SINCE  GOT PNC WITH [facility]- UNTIL 21 WEEKS - NONE SINCE . DENIES HSV AND MRSA.  LAST DOSE OF METHADONE WAS 08-2017.

## 2018-09-02 NOTE — H&P (Signed)
LABOR AND DELIVERY ADMISSION HISTORY AND PHYSICAL NOTE  Mehreen Guaman is a 28 y.o. female G3P2002 with IUP at [redacted]w[redacted]d by 13 week ultrasound presenting for SOL. Patient admitted from MAU for labor, once admitted to birthing suites found to be 10 cm and involuntarily pushing.  She reports positive fetal movement. She denies leakage of fluid or vaginal bleeding.  Prenatal History/Complications: PNC at [facility]  Pregnancy complications:  - limited PNC, 2 visits  -h/o prior drug use and methadone use, UDS neg at admit  -h/o shoulder dystocia    Past Medical History: Past Medical History:  Diagnosis Date  . Medical history non-contributory     Past Surgical History: Past Surgical History:  Procedure Laterality Date  . wisdom toothh Bilateral     Obstetrical History: OB History    Gravida  3   Para  2   Term  2   Preterm      AB      Living  2     SAB      TAB      Ectopic      Multiple  0   Live Births  2           Social History: Social History   Socioeconomic History  . Marital status: Divorced    Spouse name: Not on file  . Number of children: Not on file  . Years of education: Not on file  . Highest education level: Not on file  Occupational History  . Not on file  Social Needs  . Financial resource strain: Not on file  . Food insecurity:    Worry: Not on file    Inability: Not on file  . Transportation needs:    Medical: Not on file    Non-medical: Not on file  Tobacco Use  . Smoking status: Light Tobacco Smoker    Packs/day: 0.50    Types: Cigarettes  . Smokeless tobacco: Never Used  Substance and Sexual Activity  . Alcohol use: No  . Drug use: Not Currently  . Sexual activity: Not Currently    Partners: Male    Birth control/protection: None  Lifestyle  . Physical activity:    Days per week: Not on file    Minutes per session: Not on file  . Stress: Not on file  Relationships  . Social connections:    Talks on phone: Not on  file    Gets together: Not on file    Attends religious service: Not on file    Active member of club or organization: Not on file    Attends meetings of clubs or organizations: Not on file    Relationship status: Not on file  Other Topics Concern  . Not on file  Social History Narrative  . Not on file    Family History: Family History  Adopted: Yes    Allergies: Allergies  Allergen Reactions  . Penicillins Hives    Has patient had a PCN reaction causing immediate rash, facial/tongue/throat swelling, SOB or lightheadedness with hypotension: Yes Has patient had a PCN reaction causing severe rash involving mucus membranes or skin necrosis: No Has patient had a PCN reaction that required hospitalization: No Has patient had a PCN reaction occurring within the last 10 years: Unknown If all of the above answers are "NO", then may proceed with Cephalosporin use.    Medications Prior to Admission  Medication Sig Dispense Refill Last Dose  . Prenatal MV-Min-FA-Omega-3 (PRENATAL GUMMIES/DHA & FA) 0.4-32.5  MG CHEW Chew by mouth.   09/01/2018 at Unknown time  . acetaminophen (TYLENOL) 500 MG tablet Take 500 mg by mouth every 6 (six) hours as needed for mild pain.   Taking  . clindamycin (CLEOCIN) 300 MG capsule Take 300 mg by mouth 3 (three) times daily.   Not Taking  . nitrofurantoin, macrocrystal-monohydrate, (MACROBID) 100 MG capsule Take 1 capsule (100 mg total) by mouth 2 (two) times daily. (Patient not taking: Reported on 04/13/2018) 14 capsule 0 Not Taking     Review of Systems  All systems reviewed and negative except as stated in HPI  Physical Exam Blood pressure 135/62, pulse 96, resp. rate 20, height 5\' 3"  (1.6 m), weight 106.5 kg, last menstrual period 09/14/2017, unknown if currently breastfeeding. General appearance: alert, oriented, NAD, pushing involuntarily  Lungs: normal respiratory effort Heart: regular rate Abdomen: soft, non-tender; gravid, FH appropriate for  GA Extremities: No calf swelling or tenderness Presentation: cephalic Dilation: 10 Effacement (%): 90 Station: 0 Exam by:: E Rothermel RN  Prenatal labs: ABO, Rh:  A+ Antibody:  Negative  Rubella:  Immune  RPR:   Non-reactive 2018, repeat pending  HBsAg:   Negative 2018, repeat pending  HIV:   Non-reactive 2018, repeat pending  GC/Chlamydia: Pending  GBS:   PCR negative  2-hr GTT: Not performed  Genetic screening:  Not performed  Anatomy US: Normal   Prenatal Transfer Tool  Maternal Diabetes: No Genetic Screening: Declined Maternal Ultrasounds/Referrals: Normal Fetal Ultrasounds or other Referrals:  None Maternal Substance Abuse:  No Significant Maternal Medications:  None Significant Maternal Lab Results: Lab values include: Group B Strep negative  Results for orders placed or performed during the hospital encounter of 09/02/18 (from the past 24 hour(s))  Urine rapid drug screen (hosp performed)   Collection Time: 09/02/18  2:13 AM  Result Value Ref Range   Opiates NONE DETECTED NONE DETECTED   Cocaine NONE DETECTED NONE DETECTED   Benzodiazepines NONE DETECTED NONE DETECTED   Amphetamines NONE DETECTED NONE DETECTED   Tetrahydrocannabinol NONE DETECTED NONE DETECTED   Barbiturates NONE DETECTED NONE DETECTED  Group B strep by PCR   Collection Time: 09/02/18  3:23 AM  Result Value Ref Range   Group B strep by PCR NEGATIVE NEGATIVE  CBC   Collection Time: 09/02/18  5:45 AM  Result Value Ref Range   WBC 16.5 (H) 4.0 - 10.5 K/uL   RBC 4.95 3.87 - 5.11 MIL/uL   Hemoglobin 13.9 12.0 - 15.0 g/dL   HCT 58.5 27.7 - 82.4 %   MCV 85.7 80.0 - 100.0 fL   MCH 28.1 26.0 - 34.0 pg   MCHC 32.8 30.0 - 36.0 g/dL   RDW 23.5 36.1 - 44.3 %   Platelets 185 150 - 400 K/uL   nRBC 0.0 0.0 - 0.2 %    Patient Active Problem List   Diagnosis Date Noted  . Indication for care in labor or delivery 09/02/2018  . Encounter for supervision of normal pregnancy, unspecified, unspecified  trimester 02/16/2018  . History of shoulder dystocia in prior pregnancy 02/08/2017  . Term pregnancy 02/05/2017  . Seasonal allergic rhinitis due to pollen 12/02/2016  . History of drug abuse (HCC) 08/13/2016  . Supervision of normal pregnancy, antepartum 07/16/2016    Assessment: Euella Wellard is a 28 y.o. G3P2002 at [redacted]w[redacted]d here for SOL.   #Labor: Expectant management.  #Pain: Maternal support.  #ID:  GBS PCR negative.   De Hollingshead 09/02/2018, 6:52 AM

## 2018-09-03 ENCOUNTER — Other Ambulatory Visit: Payer: Self-pay

## 2018-09-03 NOTE — Clinical Social Work Maternal (Signed)
CLINICAL SOCIAL WORK MATERNAL/CHILD NOTE  Patient Details  Name: Joan Mccarty MRN: [id number] Date of Birth: 08/15/1990  Date:  09/03/2018  Clinical Social Worker Initiating Note:  Keiondra Brookover, LCSW     Date/Time: Initiated:  09/03/18/1050             Child's Name:  Joan Mccarty   Biological Parents:  Mother, Father(Father: Joshua Mccarty)   Need for Interpreter:  None   Reason for Referral:  Late or No Prenatal Care (Limited Prenatal care (2 visits))   Address:  [address] [place] [address]    Phone number:  [phone] (home)     Additional phone number:   Household Members/Support Persons (HM/SP):   Household Member/Support Person 1, Household Member/Support Person 2, Household Member/Support Person 3   HM/SP Name Relationship DOB or Age  HM/SP -1 Joshua Mccarty FOB 09/16/1980  HM/SP -2 Sophia Aceto daughter 09/06/12  HM/SP -3 Bentley Mccarty Lindy son 02/06/17  HM/SP -4     HM/SP -5     HM/SP -6     HM/SP -7     HM/SP -8       Natural Supports (not living in the home): Parent, Other (Comment), Friends(FOB mom)   Professional Supports:None   Employment:Unemployed   Type of Work:     Education:  High school graduate   Homebound arranged:    Financial Resources:Medicaid   Other Resources: WIC, Food Stamps    Cultural/Religious Considerations Which May Impact Care:   Strengths: Ability to meet basic needs , Home prepared for child , Pediatrician chosen   Psychotropic Medications:         Pediatrician:    [place] area  Pediatrician List:   [place]   [place] Other([facility])  [place]   [place]   [place]   [place]     Pediatrician Fax Number:    Risk Factors/Current Problems: None   Cognitive State: Able to Concentrate , Alert , Linear Thinking , Goal Oriented    Mood/Affect: Calm , Interested ,  Happy    CSW Assessment:CSW spoke with MOB at bedside to discuss consult for limited prenatal care. CSW introduced self and explained reason for consult. MOB was welcoming and engaged during assessment. MOB reported that she resides with FOB and 2 other children. MOB reported that she is currently unemployed and receives food stamps. MOB reported that she is planning to re-certify for WIC. MOB reported that she has all items needed to care for the baby. CSW inquired about MOB's support system, MOB reported that her supports were FOB, her dad, FOB's mom and her friend. MOB reported that her other children were with her parents while she was in the hospital. CSW inquired about MOB's limited prenatal care, MOB was unable to recall how many visits. MOB reported that she found out about her pregnancy late 12 or 13 weeks and that's why she had limited prenatal care.   CSW inquired about MOB's mental health history, MOB denied any mental health history. MOB denied any postpartum depression after giving birth to her 2 older children. MOB presented calm and did not demonstrate any acute mental health signs/symptoms. CSW observed infant sleeping next to MOB, MOB appeared attached and bonded with infant. CSW assessed for safety, MOB denied SI, HI and domestic violence.   CSW provided education regarding the baby blues period vs. perinatal mood disorders, discussed treatment and gave resources for mental health follow   up if concerns arise.  CSW recommends self-evaluation during the postpartum time period using the New Mom Checklist from Postpartum Progress and encouraged MOB to contact a medical professional if symptoms are noted at any time.    CSW provided review of Sudden Infant Death Syndrome (SIDS) precautions.    CSW informed MOB about hospital drug policy due to limited prenatal care. MOB verbalized understanding reporting that she experienced this with 2 older children because she was on Methadone. MOB  reported that she is no longer on Methadone, noting it was hard to get off. CSW positively affirmed MOB's efforts and ability to get off Methadone. CSW inquired about MOB's substance use during pregnancy, MOB denied any substance use during pregnancy. CSW informed MOB that infant's UDS was negative and CDS would continue to be monitored a CPS report would be made if warranted. MOB reported that she had CPS hx due to her Methadone use and her last open case was in 2018 when she had her son.   CSW identifies no further need for intervention and no barriers to discharge at this time.  CSW Plan/Description: No Further Intervention Required/No Barriers to Discharge, Sudden Infant Death Syndrome (SIDS) Education, Perinatal Mood and Anxiety Disorder (PMADs) Education, CSW Will Continue to Monitor Umbilical Cord Tissue Drug Screen Results and Make Report if Warranted, Hospital Drug Screen Policy Information    Jailey Booton L Jelicia Nantz, LCSW 09/03/2018, 11:34 AM           

## 2018-09-03 NOTE — Lactation Note (Signed)
This note was copied from a baby's chart. Lactation Consultation Note  Patient Name: Joan Mccarty SHFWY'O Date: 09/03/2018 Reason for consult: Initial assessment P3, 35 hours female infant. Per mom, she doesn't have a breast pump at home.  Per mom, she has very sensitive breast and she had her nipples pierced 3 years ago. She attempted breastfeeding with her other two children but they would not latch, so she pumped for one to three months. LC noticed right breast is inverted only. Mom latched infant to left breast using football hold, infant mouth was wide, wide gape and transferring milk well with  swallows heard. Mom keep complaining about breast sensitivity infant feed for only 10 minutes due to mom not liking the breastfeeding experience. Reassured mom infant had deep latch, was transferring milk well, had mom  take infant off breast to show her, her nipple was well rounded with  not trauma noted. Mom latched infant without NS today , Per mom, she asked for NS because of her breast sensitivity but she feels breast feel the same with or without NS. Mom hand expressed 16 ml of colostrum easily and infant was given 7 ml because mom could not breastfeed past 10 minutes due to  breast complaints. LC explained how to use Harmony hand pump fitted with 30 mm Breast Flange, LC discussed assembly, re-assembly, cleaning and storage. LC discussed I & O. Mom will breastfeed according hunger cues and 8 or more times within 24 hours. Mom will call Nurse or LC if she has any questions, concerns or needs assistance with latching infant to breast.  Mom made aware of O/P services, breastfeeding support groups, community resources, and our phone # for post-discharge questions.  Mom's plan: 1. She will try breastfeed for 10-15 minute, then hand express or use manual hand pump given by LC  afterwards and give back EBM based on infant's age/ hours due to her breast sensitivity. 2. Mom will breastfeed  first at next feed and give back 10 ml of EBM that mom has in her  room. 3. Mom will be given coconut oil by Nurse. .    Maternal Data Formula Feeding for Exclusion: No Has patient been taught Hand Expression?: Yes Does the patient have breastfeeding experience prior to this delivery?: Yes(Pumping only for one month other two children did not latch.)  Feeding Feeding Type: Breast Fed  LATCH Score Latch: Grasps breast easily, tongue down, lips flanged, rhythmical sucking.  Audible Swallowing: Spontaneous and intermittent  Type of Nipple: Inverted  Comfort (Breast/Nipple): Soft / non-tender  Hold (Positioning): Assistance needed to correctly position infant at breast and maintain latch.  LATCH Score: 7  Interventions Interventions: Breast feeding basics reviewed;Assisted with latch;Hand express;Adjust position;Support pillows;Position options;Expressed milk;Hand pump  Lactation Tools Discussed/Used Tools: Nipple Dorris Carnes [facility] Program: No Pump Review: Setup, frequency, and cleaning;Milk Storage Initiated by:: Danelle Earthly, IBCLC Date initiated:: 09/03/18   Consult Status Consult Status: Follow-up Date: 09/04/18 Follow-up type: In-patient    Danelle Earthly 09/03/2018, 5:31 PM

## 2018-09-03 NOTE — Progress Notes (Signed)
Post Partum Day 1  Subjective: up ad lib, voiding, tolerating PO and + flatus. Patient is having difficulty with the latch during breastfeeding and is requesting lactation consult.  Objective: Blood pressure 128/77, pulse 89, temperature 98.4 F (36.9 C), temperature source Oral, resp. rate 17, height 5\' 3"  (1.6 m), weight 106.5 kg, last menstrual period 09/14/2017, SpO2 99 %, unknown if currently breastfeeding.  Physical Exam:  General: alert, cooperative, appears stated age and no distress Lochia: appropriate Uterine Fundus: firm Incision: N/A DVT Evaluation: No evidence of DVT seen on physical exam. No significant calf/ankle edema.  Recent Labs    09/02/18 0545  HGB 13.9  HCT 42.4    Assessment/Plan: Plan for discharge tomorrow, Breastfeeding, Lactation consult and Contraception desires BTL, will sign papers   LOS: 1 day   Joan Mccarty 09/03/2018, 8:21 AM

## 2018-09-04 ENCOUNTER — Encounter (HOSPITAL_COMMUNITY): Payer: Self-pay

## 2018-09-04 LAB — RUBELLA SCREEN: Rubella: 1.35 index (ref >0.99)

## 2018-09-04 MED ORDER — IBUPROFEN 600 MG PO TABS: 600.0000 mg | ORAL_TABLET | Freq: Four times a day (QID) | ORAL | 0 refills | Status: AC

## 2018-09-04 MED ORDER — ACETAMINOPHEN 325 MG PO TABS: 650.0000 mg | ORAL_TABLET | ORAL | 0 refills | Status: AC | PRN

## 2018-09-04 MED ORDER — SENNOSIDES-DOCUSATE SODIUM 8.6-50 MG PO TABS: 2.0000 | ORAL_TABLET | ORAL | 0 refills | Status: AC

## 2018-09-04 NOTE — Discharge Summary (Addendum)
OB Discharge Summary     Patient Name: Joan Mccarty DOB: [birth year] MRN: [id number]  Date of admission: 09/02/2018 Delivering MD: Arvilla Market   Date of discharge: 09/04/2018  Admitting diagnosis: 40.5WKS CTX Intrauterine pregnancy: [redacted]w[redacted]d     Secondary diagnosis:  Active Problems:   Indication for care in labor or delivery  Additional problems: none     Discharge diagnosis: Term Pregnancy Delivered                                                                                                Post partum procedures:none  Augmentation: none  Complications: None  Hospital course:  Onset of Labor With Vaginal Delivery     28 y.o. yo G3P3003 at [redacted]w[redacted]d was admitted in Active Labor on 09/02/2018. Patient had an uncomplicated labor course as follows:  Membrane Rupture Time/Date:   ,    Intrapartum Procedures: Episiotomy: None [1]                                         Lacerations:  1st degree [2]  Patient had a delivery of a Viable infant. 09/02/2018  Information for the patient's newborn:  Ivet, Szymborski Girl Rasheema [[id number]]  Delivery Method: Vaginal, Spontaneous(Filed from Delivery Summary)    Pateint had an uncomplicated postpartum course.  She is ambulating, tolerating a regular diet, passing flatus, and urinating well. Patient is discharged home in stable condition on 09/04/18.   Physical exam  Vitals:   09/03/18 1351 09/03/18 1414 09/03/18 2202 09/04/18 0537  BP: (!) 142/91 114/62 129/68 131/82  Pulse: 99 91 90 99  Resp: 16  18 18   Temp: 98.4 F (36.9 C)  98.2 F (36.8 C) 98 F (36.7 C)  TempSrc: Oral  Axillary Oral  SpO2:   98% 99%  Weight:      Height:       General: alert, cooperative and no distress Lochia: appropriate Uterine Fundus: firm Incision: N/A DVT Evaluation: No evidence of DVT seen on physical exam. No cords or calf tenderness. No significant calf/ankle edema. Labs: Lab Results  Component Value Date   WBC 16.5 (H) 09/02/2018    HGB 13.9 09/02/2018   HCT 42.4 09/02/2018   MCV 85.7 09/02/2018   PLT 185 09/02/2018   CMP Latest Ref Rng & Units 11/12/2017  Glucose 65 - 99 mg/dL 761(Y)  BUN 6 - 20 mg/dL 5(L)  Creatinine 0.73 - 1.00 mg/dL 7.10  Sodium 626 - 948 mmol/L 133(L)  Potassium 3.5 - 5.1 mmol/L 3.3(L)  Chloride 101 - 111 mmol/L 99(L)  CO2 22 - 32 mmol/L 23  Calcium 8.9 - 10.3 mg/dL 5.4(O)  Total Protein 6.5 - 8.1 g/dL 7.5  Total Bilirubin 0.3 - 1.2 mg/dL 0.6  Alkaline Phos 38 - 126 U/L 58  AST 15 - 41 U/L 12(L)  ALT 14 - 54 U/L 12(L)    Discharge instruction: per After Visit Summary and "Baby and Me Booklet".  After visit meds:  Allergies as of 09/04/2018  Reactions   Penicillins Hives   Has patient had a PCN reaction causing immediate rash, facial/tongue/throat swelling, SOB or lightheadedness with hypotension: Yes Has patient had a PCN reaction causing severe rash involving mucus membranes or skin necrosis: No Has patient had a PCN reaction that required hospitalization: No Has patient had a PCN reaction occurring within the last 10 years: Unknown If all of the above answers are "NO", then may proceed with Cephalosporin use.      Medication List    TAKE these medications   acetaminophen 325 MG tablet Commonly known as:  TYLENOL Take 2 tablets (650 mg total) by mouth every 4 (four) hours as needed (for pain scale < 4).   ibuprofen 600 MG tablet Commonly known as:  ADVIL,MOTRIN Take 1 tablet (600 mg total) by mouth every 6 (six) hours.   prenatal multivitamin Tabs tablet Take 1 tablet by mouth daily at 12 noon.   senna-docusate 8.6-50 MG tablet Commonly known as:  Senokot-S Take 2 tablets by mouth daily. Start taking on:  September 05, 2018       Diet: routine diet  Activity: Advance as tolerated. Pelvic rest for 6 weeks.   Outpatient follow up:6 weeks Follow up Appt:No future appointments. Follow up Visit:No follow-ups on file.  Postpartum contraception: Tubal  Ligation  Newborn Data: Live born female  Birth Weight: 7 lb 2.5 oz (3246 g) APGAR: 8, 9  Newborn Delivery   Birth date/time:  09/02/2018 06:11:00 Delivery type:  Vaginal, Spontaneous     Baby Feeding: Breast Disposition:home with mother   09/04/2018 Standley Brooking, DO  OB FELLOW DISCHARGE ATTESTATION  I have seen and examined this patient and agree with above documentation in the resident's note.   Gwenevere Abbot, MD  OB Fellow  09/05/2018, 6:46 AM

## 2018-09-04 NOTE — Lactation Note (Signed)
This note was copied from a baby's chart. Lactation Consultation Note  Patient Name: Joan Mccarty EHOZY'Y Date: 09/04/2018 Reason for consult: Follow-up assessment  Mom says that her nipples are sore throughout the entire feeding, but says she has been told that the latch looks fine. "[name]" began crying & with crying she had reduced elevation & a notch was seen in the tip of her tongue. I raised the possibility of posterior tongue restriction, but Mom had no questions. Mom says she has no needs (regarding lactation) at this time.  Lurline Hare [facility] 09/04/2018, 10:16 AM

## 2019-03-18 IMAGING — US US MFM OB FOLLOW-UP
1 series · 14 of 28 positions shown · non-contrast
Comparison: none

[Series 1: us mfm ob follow-up · 14 of 40 slices shown]
[im 2/40]
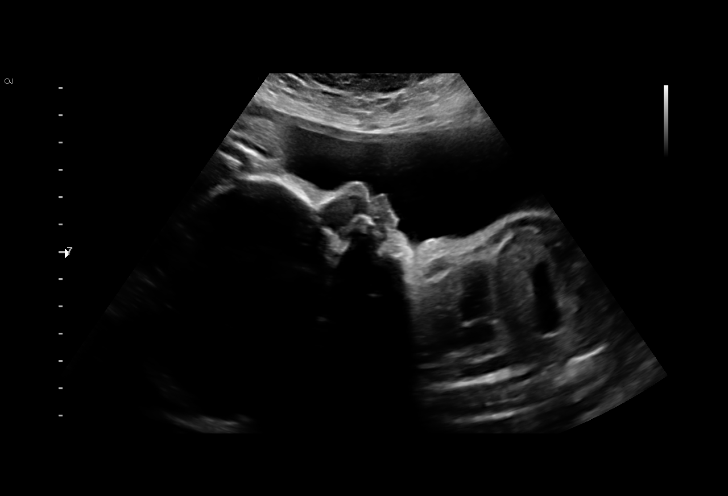
[im 5/40]
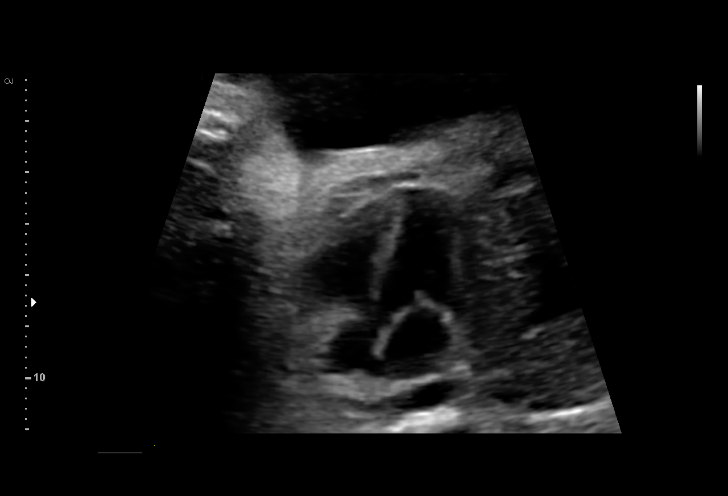
[im 8/40]
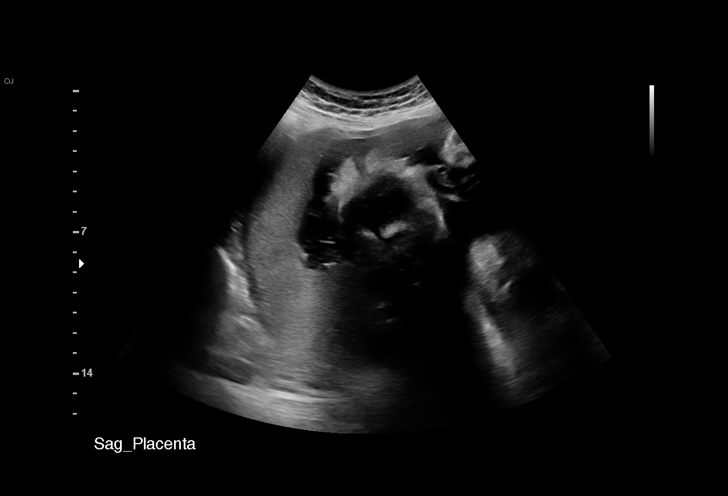
[im 11/40]
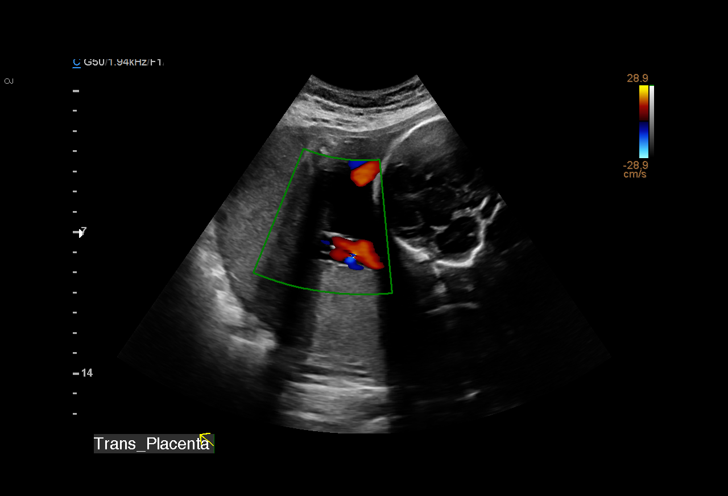
[im 14/40]
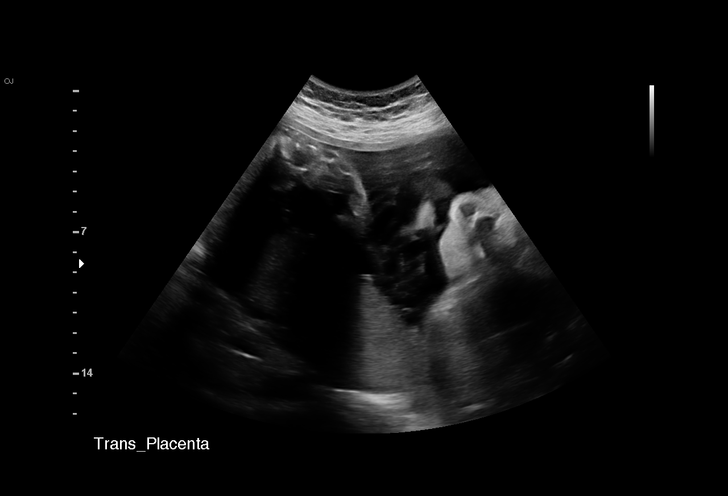
[im 16/40]
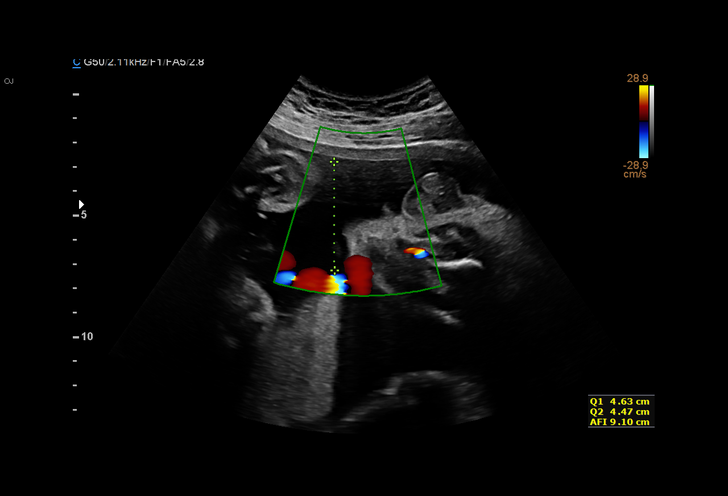
[im 19/40]
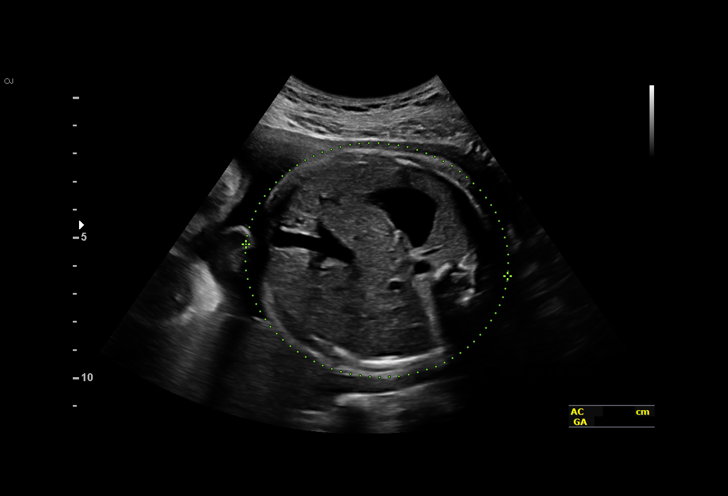
[im 22/40]
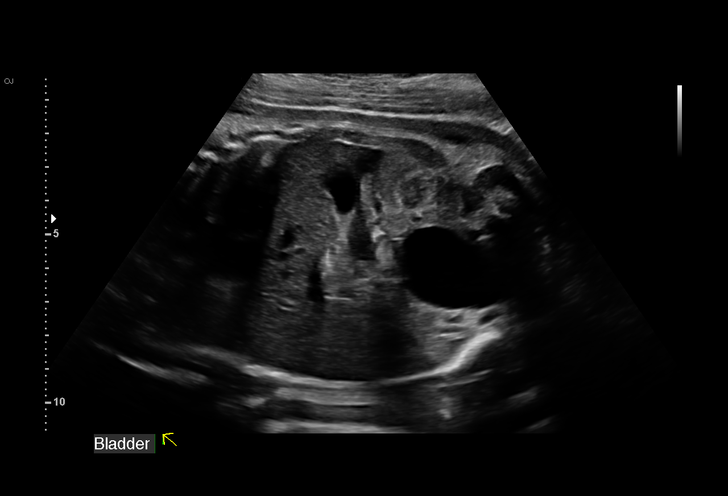
[im 25/40]
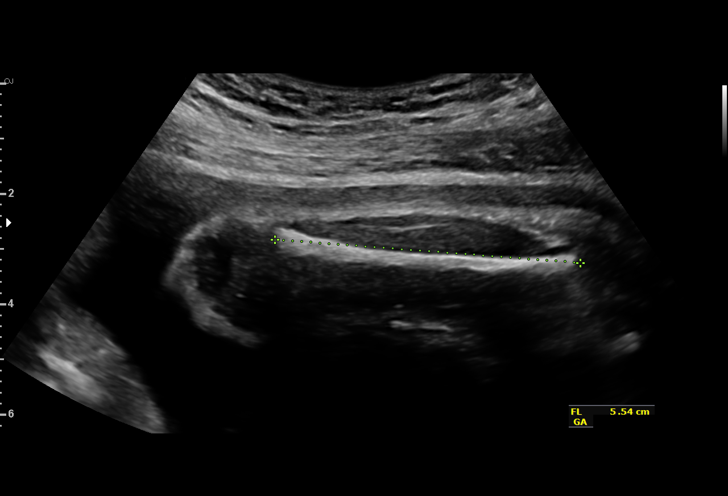
[im 28/40]
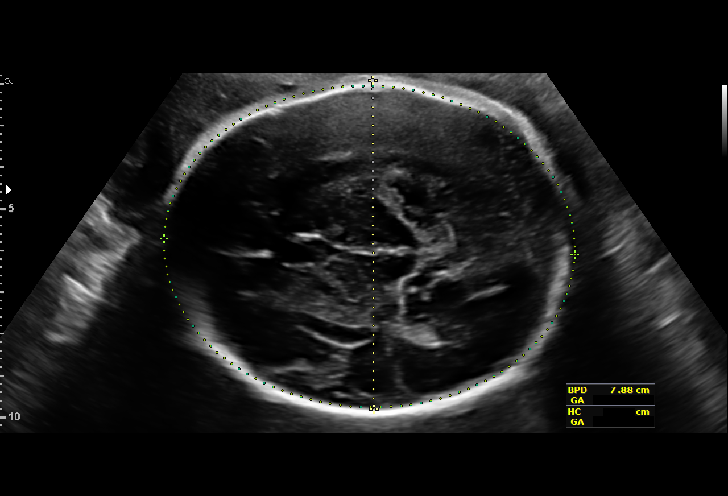
[im 31/40]
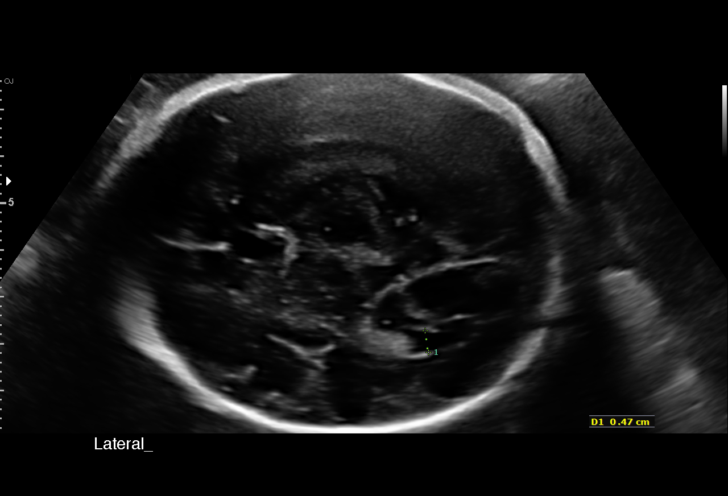
[im 34/40]
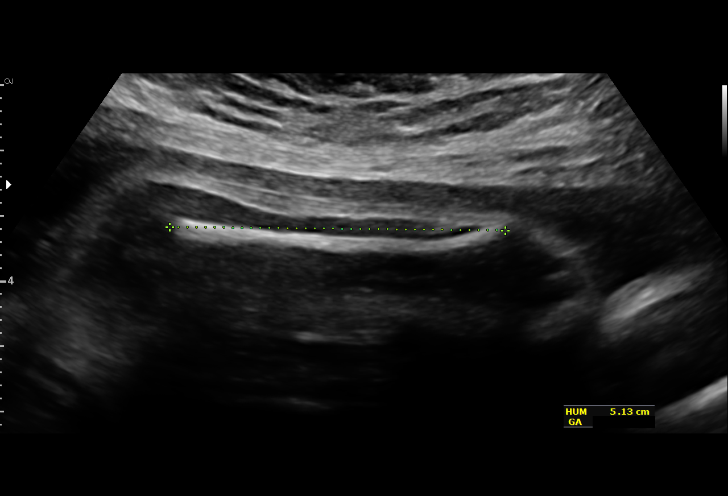
[im 37/40]
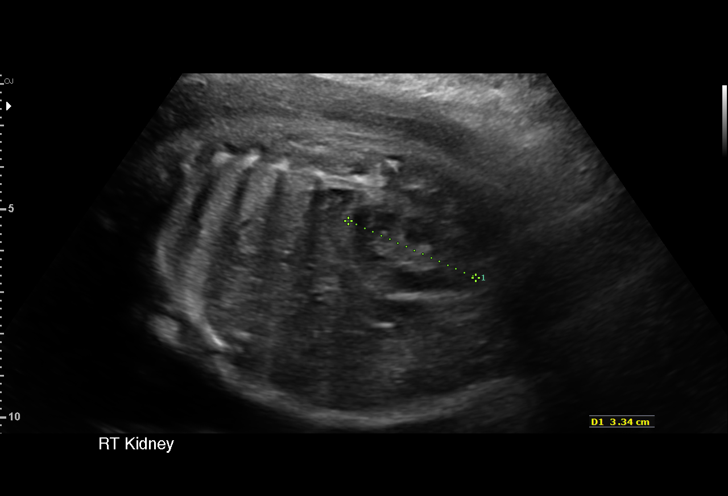
[im 40/40]
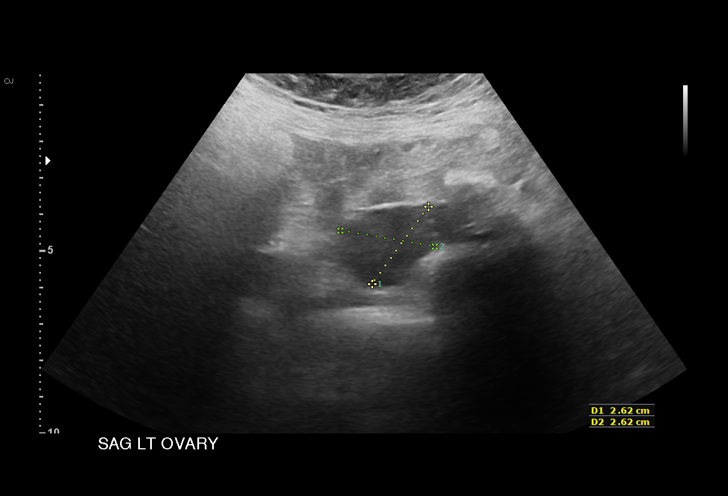

[14 of 28 positions shown; findings below may reference images not displayed]

([HOSPITAL])
[REDACTED]

1  CHEOL HA BMTC            [id number]      [id number]     [id number]
Indications

30 weeks gestation of pregnancy
Substance abuse affecting pregnancy,
antepartum (Methadone)
Tobacco use complicating pregnancy, third
trimester
OB History

Gravidity:    2         Term:   1        Prem:   0        SAB:   0
TOP:          0       Ectopic:  0        Living: 1
Fetal Evaluation

Num Of Fetuses:     1
Cardiac Activity:   Observed
Presentation:       Breech
Placenta:           Posterior, above cervical os
P. Cord Insertion:  Visualized

Amniotic Fluid
AFI FV:      Subjectively within normal limits

AFI Sum(cm)     %Tile       Largest Pocket(cm)
12.61           35
RUQ(cm)                     LUQ(cm)        LLQ(cm)
4.63
Biometry

BPD:      78.6  mm     G. Age:  31w 4d         80  %    CI:        78.65   %   70 - 86
FL/HC:      19.9   %   19.2 -
HC:      280.3  mm     G. Age:  30w 5d         30  %    HC/AC:      1.01       0.99 -
AC:      278.5  mm     G. Age:  32w 0d         89  %    FL/BPD:     70.9   %   71 - 87
FL:       55.7  mm     G. Age:  29w 2d         17  %    FL/AC:      20.0   %   20 - 24
HUM:      51.3  mm     G. Age:  30w 0d         50  %
CER:      40.4  mm     G. Age:  34w 3d       > 95  %

Est. FW:    8515  gm    3 lb 11 oz      69  %
Gestational Age

U/S Today:     30w 6d                                        EDD:   01/23/17
Best:          30w 1d    Det. By:   Early Ultrasound         EDD:   01/28/17
Anatomy

Cranium:               Appears normal         Aortic Arch:            Previously seen
Cavum:                 Appears normal         Ductal Arch:            Previously seen
Ventricles:            Appears normal         Diaphragm:              Previously seen
Choroid Plexus:        Appears normal;        Stomach:                Appears normal, left
CPC resolved                                   sided
Cerebellum:            Appears normal         Abdomen:                Appears normal
Posterior Fossa:       Previously seen        Abdominal Wall:         Previously seen
Nuchal Fold:           Previously seen        Cord Vessels:           Appears normal (3
vessel cord)
Face:                  Orbits and profile     Kidneys:                Appear normal
previously seen
Lips:                  Previously seen        Bladder:                Appears normal
Thoracic:              Appears normal         Spine:                  Previously seen
Heart:                 Appears normal         Upper Extremities:      Previously seen
(4CH, axis, and situs
RVOT:                  Previously seen        Lower Extremities:      Previously seen
LVOT:                  Appears normal

Other:  Fetus appears to be a male previously seen. Heels and 5th digit
previously visualized. Nasal bone previously visualized. Open hands
previously visualized.
Cervix Uterus Adnexa

Cervix
Not visualized (advanced GA >46wks)

Uterus
No abnormality visualized.

Left Ovary
Within normal limits.

Right Ovary
Not visualized. No adnexal mass visualized.
Impression

SIUP at 30+1 weeks
Normal interval anatomy; anatomic survey complete
Normal amniotic fluid volume
Appropriate interval growth with EFW at the 69th %tile
Recommendations

Follow-up ultrasound for growth in 6 weeks (Methadone)

## 2021-05-05 ENCOUNTER — Emergency Department (HOSPITAL_BASED_OUTPATIENT_CLINIC_OR_DEPARTMENT_OTHER): Payer: Medicaid Other | Admitting: Radiology

## 2021-05-05 ENCOUNTER — Emergency Department (HOSPITAL_BASED_OUTPATIENT_CLINIC_OR_DEPARTMENT_OTHER)
Admission: EM | Admit: 2021-05-05 | Discharge: 2021-05-05 | Disposition: A | Discharge status: Home / Self Care | Payer: Medicaid Other | Attending: Emergency Medicine | Admitting: Emergency Medicine

## 2021-05-05 ENCOUNTER — Encounter (HOSPITAL_BASED_OUTPATIENT_CLINIC_OR_DEPARTMENT_OTHER): Payer: Self-pay

## 2021-05-05 ENCOUNTER — Other Ambulatory Visit: Payer: Self-pay

## 2021-05-05 DIAGNOSIS — Y9301 Activity, walking, marching and hiking: Secondary | ICD-10-CM | POA: Insufficient documentation

## 2021-05-05 DIAGNOSIS — S99911A Unspecified injury of right ankle, initial encounter: Secondary | ICD-10-CM | POA: Diagnosis present

## 2021-05-05 DIAGNOSIS — S93401A Sprain of unspecified ligament of right ankle, initial encounter: Secondary | ICD-10-CM | POA: Insufficient documentation

## 2021-05-05 DIAGNOSIS — F17210 Nicotine dependence, cigarettes, uncomplicated: Secondary | ICD-10-CM | POA: Insufficient documentation

## 2021-05-05 DIAGNOSIS — W010XXA Fall on same level from slipping, tripping and stumbling without subsequent striking against object, initial encounter: Secondary | ICD-10-CM | POA: Insufficient documentation

## 2021-05-05 HISTORY — DX: Autoimmune thyroiditis: E06.3

## 2021-05-05 NOTE — Discharge Instructions (Addendum)
Use the crutches for support.  Wear the brace to stabilize your ankle.  Take over-the-counter medications such as ibuprofen or Tylenol to help with the pain.  Follow-up with your orthopedic doctor if the symptoms do not resolve over the next week

## 2021-05-05 NOTE — ED Triage Notes (Signed)
Pt was walking in the grass when she slipped and fell PTA. Pt c/o right ankle pain and foot pain "all over". Denies any other injuries.

## 2021-05-05 NOTE — ED Provider Notes (Signed)
[facility] EMERGENCY DEPT Provider Note   CSN: [id number] Arrival date & time: 05/05/21  2100     History Chief Complaint  Patient presents with  . Ankle Pain  . Foot Injury    Joan Mccarty is a 30 y.o. female.   Ankle Pain Foot Injury  Patient presents ED for evaluation of an ankle injury.  Patient was walking in the grass when she slipped and fell.  She is now having pain primarily on the right side of her ankle and foot.  She denies any other injuries.  No numbness or weakness  Past Medical History:  Diagnosis Date  . Hashimoto's disease   . Medical history non-contributory     Patient Active Problem List   Diagnosis Date Noted  . Indication for care in labor or delivery 09/02/2018  . Encounter for supervision of normal pregnancy, unspecified, unspecified trimester 02/16/2018  . History of shoulder dystocia in prior pregnancy 02/08/2017  . Term pregnancy 02/05/2017  . Seasonal allergic rhinitis due to pollen 12/02/2016  . History of drug abuse (HCC) 08/13/2016  . Supervision of normal pregnancy, antepartum 07/16/2016    Past Surgical History:  Procedure Laterality Date  . wisdom toothh Bilateral      OB History     Gravida  3   Para  3   Term  3   Preterm      AB      Living  3      SAB      IAB      Ectopic      Multiple  0   Live Births  3           Family History  Adopted: Yes    Social History   Tobacco Use  . Smoking status: Light Smoker    Packs/day: 0.50    Types: Cigarettes  . Smokeless tobacco: Current  Vaping Use  . Vaping Use: Every day  Substance Use Topics  . Alcohol use: No  . Drug use: Not Currently    Home Medications Prior to Admission medications   Medication Sig Start Date End Date Taking? Authorizing Provider  acetaminophen (TYLENOL) 325 MG tablet Take 2 tablets (650 mg total) by mouth every 4 (four) hours as needed (for pain scale < 4). 09/04/18   Kellogg, Alexandra L, DO   ibuprofen (ADVIL,MOTRIN) 600 MG tablet Take 1 tablet (600 mg total) by mouth every 6 (six) hours. 09/04/18   Kellogg, Brooke Dare, DO  Prenatal Vit-Fe Fumarate-FA (PRENATAL MULTIVITAMIN) TABS tablet Take 1 tablet by mouth daily at 12 noon.    [provider]  senna-docusate (SENOKOT-S) 8.6-50 MG tablet Take 2 tablets by mouth daily. 09/05/18   Kellogg, Brooke Dare, DO    Allergies    Penicillins  Review of Systems   Review of Systems  All other systems reviewed and are negative.  Physical Exam Updated Vital Signs BP (!) 115/96 (BP Location: Left Arm)   Pulse (!) 105   Temp 98.4 F (36.9 C)   Resp 18   Ht 1.575 m (5\' 2" )   Wt 127 kg   SpO2 95%   BMI 51.21 kg/m   Physical Exam Vitals and nursing note reviewed.  Constitutional:      General: She is not in acute distress.    Appearance: She is well-developed.  HENT:     Head: Normocephalic and atraumatic.     Right Ear: External ear normal.     Left Ear:  External ear normal.  Eyes:     General: No scleral icterus.       Right eye: No discharge.        Left eye: No discharge.     Conjunctiva/sclera: Conjunctivae normal.  Neck:     Trachea: No tracheal deviation.  Cardiovascular:     Rate and Rhythm: Normal rate.  Pulmonary:     Effort: Pulmonary effort is normal. No respiratory distress.     Breath sounds: No stridor.  Abdominal:     General: There is no distension.  Musculoskeletal:        General: Tenderness present. No swelling or deformity.     Cervical back: Neck supple.     Right ankle: Tenderness present over the lateral malleolus. No base of 5th metatarsal or proximal fibula tenderness.  Skin:    General: Skin is warm and dry.     Findings: No rash.  Neurological:     Mental Status: She is alert.     Cranial Nerves: Cranial nerve deficit: no gross deficits.    ED Results / Procedures / Treatments   Labs (all labs ordered are listed, but only abnormal results are displayed) Labs Reviewed - No  data to display  EKG None  Radiology DG Ankle Complete Right  Result Date: 05/05/2021 CLINICAL DATA:  Recent fall with right ankle pain, initial encounter EXAM: RIGHT ANKLE - COMPLETE 3+ VIEW COMPARISON:  None. FINDINGS: Lateral soft tissue swelling is noted. No acute fracture or dislocation is seen. No other focal abnormality is noted. IMPRESSION: Lateral soft tissue swelling without acute bony abnormality. Electronically Signed   By: Alcide Clever M.D.   On: 05/05/2021 21:44   DG Foot Complete Right  Result Date: 05/05/2021 CLINICAL DATA:  Recent slip and fall with foot pain, initial encounter EXAM: RIGHT FOOT COMPLETE - 3+ VIEW COMPARISON:  None. FINDINGS: There is no evidence of fracture or dislocation. There is no evidence of arthropathy or other focal bone abnormality. Soft tissues are unremarkable. IMPRESSION: No acute abnormality noted. Electronically Signed   By: Alcide Clever M.D.   On: 05/05/2021 21:34    Procedures Procedures   Medications Ordered in ED Medications - No data to display  ED Course  I have reviewed the triage vital signs and the nursing notes.  Pertinent labs & imaging results that were available during my care of the patient were reviewed by me and considered in my medical decision making (see chart for details).    MDM Rules/Calculators/A&P                           Patient presented with an ankle injury.  Tenderness was primarily on the lateral aspect.  X-rays do not show any signs of fracture or dislocation.  Symptoms consistent with an ankle sprain.  Will provide crutches as well as an ASO splint.  NSAIDs.  Rest.  Outpatient follow-up with Ortho as needed Final Clinical Impression(s) / ED Diagnoses Final diagnoses:  Sprain of right ankle, unspecified ligament, initial encounter    Rx / DC Orders ED Discharge Orders     None        Linwood Dibbles, MD 05/05/21 2245

## 2024-09-09 ENCOUNTER — Encounter (HOSPITAL_BASED_OUTPATIENT_CLINIC_OR_DEPARTMENT_OTHER): Admitting: Obstetrics and Gynecology
